# Patient Record
Sex: Male | Born: 1955 | Race: White | Hispanic: No | Marital: Married | Smoking: Never smoker
Health system: Southern US, Community
[De-identification: ages and names within clinical notes are randomized; demographics above are authoritative.]

## PROBLEM LIST (undated history)

## (undated) DIAGNOSIS — G479 Sleep disorder, unspecified: Secondary | ICD-10-CM

## (undated) DIAGNOSIS — K7030 Alcoholic cirrhosis of liver without ascites: Secondary | ICD-10-CM

## (undated) DIAGNOSIS — J948 Other specified pleural conditions: Secondary | ICD-10-CM

## (undated) DIAGNOSIS — K449 Diaphragmatic hernia without obstruction or gangrene: Secondary | ICD-10-CM

## (undated) DIAGNOSIS — R161 Splenomegaly, not elsewhere classified: Secondary | ICD-10-CM

## (undated) DIAGNOSIS — N400 Enlarged prostate without lower urinary tract symptoms: Secondary | ICD-10-CM

## (undated) DIAGNOSIS — E1165 Type 2 diabetes mellitus with hyperglycemia: Secondary | ICD-10-CM

## (undated) DIAGNOSIS — D509 Iron deficiency anemia, unspecified: Secondary | ICD-10-CM

## (undated) DIAGNOSIS — G4733 Obstructive sleep apnea (adult) (pediatric): Secondary | ICD-10-CM

## (undated) DIAGNOSIS — M1990 Unspecified osteoarthritis, unspecified site: Secondary | ICD-10-CM

## (undated) DIAGNOSIS — I10 Essential (primary) hypertension: Secondary | ICD-10-CM

## (undated) DIAGNOSIS — I8500 Esophageal varices without bleeding: Secondary | ICD-10-CM

## (undated) DIAGNOSIS — K219 Gastro-esophageal reflux disease without esophagitis: Secondary | ICD-10-CM

## (undated) DIAGNOSIS — K922 Gastrointestinal hemorrhage, unspecified: Secondary | ICD-10-CM

## (undated) DIAGNOSIS — R32 Unspecified urinary incontinence: Secondary | ICD-10-CM

## (undated) DIAGNOSIS — F1011 Alcohol abuse, in remission: Secondary | ICD-10-CM

## (undated) DIAGNOSIS — D61818 Other pancytopenia: Secondary | ICD-10-CM

## (undated) DIAGNOSIS — E785 Hyperlipidemia, unspecified: Secondary | ICD-10-CM

## (undated) DIAGNOSIS — K7460 Unspecified cirrhosis of liver: Secondary | ICD-10-CM

## (undated) DIAGNOSIS — Z86010 Personal history of colonic polyps: Secondary | ICD-10-CM

## (undated) DIAGNOSIS — F32A Depression, unspecified: Secondary | ICD-10-CM

## (undated) DIAGNOSIS — F329 Major depressive disorder, single episode, unspecified: Secondary | ICD-10-CM

## (undated) DIAGNOSIS — E1140 Type 2 diabetes mellitus with diabetic neuropathy, unspecified: Secondary | ICD-10-CM

## (undated) HISTORY — DX: Unspecified cirrhosis of liver: K74.60

## (undated) HISTORY — DX: Essential (primary) hypertension: I10

## (undated) HISTORY — DX: Unspecified osteoarthritis, unspecified site: M19.90

## (undated) HISTORY — DX: Gastro-esophageal reflux disease without esophagitis: K21.9

## (undated) HISTORY — DX: Type 2 diabetes mellitus with hyperglycemia: E11.65

## (undated) HISTORY — DX: Other pancytopenia: D61.818

## (undated) HISTORY — DX: Diaphragmatic hernia without obstruction or gangrene: K44.9

## (undated) HISTORY — DX: Gastrointestinal hemorrhage, unspecified: K92.2

## (undated) HISTORY — DX: Iron deficiency anemia, unspecified: D50.9

## (undated) HISTORY — DX: Sleep disorder, unspecified: G47.9

## (undated) HISTORY — DX: Unspecified urinary incontinence: R32

## (undated) HISTORY — PX: ESOPHAGEAL VARICE LIGATION: SHX625

## (undated) HISTORY — DX: Hyperlipidemia, unspecified: E78.5

## (undated) HISTORY — DX: Major depressive disorder, single episode, unspecified: F32.9

## (undated) HISTORY — DX: Benign prostatic hyperplasia without lower urinary tract symptoms: N40.0

## (undated) HISTORY — DX: Personal history of colonic polyps: Z86.010

## (undated) HISTORY — DX: Splenomegaly, not elsewhere classified: R16.1

## (undated) HISTORY — DX: Depression, unspecified: F32.A

## (undated) HISTORY — DX: Other specified pleural conditions: J94.8

## (undated) HISTORY — PX: ESOPHAGEAL DILATION: SHX303

## (undated) HISTORY — DX: Alcohol abuse, in remission: F10.11

## (undated) HISTORY — DX: Obstructive sleep apnea (adult) (pediatric): G47.33

## (undated) HISTORY — DX: Type 2 diabetes mellitus with diabetic neuropathy, unspecified: E11.40

## (undated) HISTORY — DX: Esophageal varices without bleeding: I85.00

## (undated) HISTORY — DX: Alcoholic cirrhosis of liver without ascites: K70.30

---

## 1999-06-20 DIAGNOSIS — E1165 Type 2 diabetes mellitus with hyperglycemia: Secondary | ICD-10-CM

## 1999-06-20 DIAGNOSIS — IMO0002 Reserved for concepts with insufficient information to code with codable children: Secondary | ICD-10-CM

## 1999-06-20 HISTORY — DX: Type 2 diabetes mellitus with hyperglycemia: E11.65

## 1999-06-20 HISTORY — DX: Reserved for concepts with insufficient information to code with codable children: IMO0002

## 2008-08-17 ENCOUNTER — Ambulatory Visit: Payer: Self-pay | Admitting: Oncology

## 2008-09-07 ENCOUNTER — Inpatient Hospital Stay: Payer: Self-pay | Admitting: *Deleted

## 2008-09-17 ENCOUNTER — Ambulatory Visit: Payer: Self-pay | Admitting: Oncology

## 2008-09-30 ENCOUNTER — Ambulatory Visit: Payer: Self-pay | Admitting: Gastroenterology

## 2008-10-04 ENCOUNTER — Inpatient Hospital Stay: Payer: Self-pay | Admitting: *Deleted

## 2008-10-17 ENCOUNTER — Ambulatory Visit: Payer: Self-pay | Admitting: Oncology

## 2008-12-22 ENCOUNTER — Ambulatory Visit: Payer: Self-pay | Admitting: Internal Medicine

## 2009-01-05 ENCOUNTER — Ambulatory Visit: Payer: Self-pay | Admitting: Internal Medicine

## 2009-01-17 ENCOUNTER — Ambulatory Visit: Payer: Self-pay | Admitting: Internal Medicine

## 2009-02-17 ENCOUNTER — Ambulatory Visit: Payer: Self-pay | Admitting: Internal Medicine

## 2009-06-19 DIAGNOSIS — J948 Other specified pleural conditions: Secondary | ICD-10-CM

## 2009-06-19 HISTORY — DX: Other specified pleural conditions: J94.8

## 2009-07-21 ENCOUNTER — Emergency Department: Payer: Self-pay | Admitting: Emergency Medicine

## 2009-09-10 ENCOUNTER — Emergency Department: Payer: Self-pay | Admitting: Internal Medicine

## 2010-04-19 ENCOUNTER — Ambulatory Visit: Payer: Self-pay | Admitting: Internal Medicine

## 2010-04-29 ENCOUNTER — Inpatient Hospital Stay: Payer: Self-pay | Admitting: Internal Medicine

## 2010-05-11 ENCOUNTER — Inpatient Hospital Stay: Payer: Self-pay | Admitting: Internal Medicine

## 2010-06-19 HISTORY — PX: TIPS PROCEDURE: SHX808

## 2010-11-05 ENCOUNTER — Other Ambulatory Visit: Payer: Self-pay | Admitting: Unknown Physician Specialty

## 2011-04-24 ENCOUNTER — Inpatient Hospital Stay: Payer: Self-pay | Admitting: Internal Medicine

## 2011-06-20 DIAGNOSIS — R161 Splenomegaly, not elsewhere classified: Secondary | ICD-10-CM

## 2011-06-20 HISTORY — DX: Splenomegaly, not elsewhere classified: R16.1

## 2011-07-21 ENCOUNTER — Ambulatory Visit: Payer: Self-pay | Admitting: Internal Medicine

## 2011-08-10 ENCOUNTER — Inpatient Hospital Stay: Payer: Self-pay | Admitting: Internal Medicine

## 2011-08-10 LAB — PROTIME-INR
INR: 1.4
Prothrombin Time: 17.9 secs — ABNORMAL HIGH (ref 11.5–14.7)

## 2011-08-10 LAB — TROPONIN I: Troponin-I: <0.02 ng/mL

## 2011-08-10 LAB — COMPREHENSIVE METABOLIC PANEL
Albumin: 2.6 g/dL — ABNORMAL LOW (ref 3.4–5.0)
Anion Gap: 12 (ref 7–16)
Bilirubin,Total: 1.3 mg/dL — ABNORMAL HIGH (ref 0.2–1.0)
Calcium, Total: 7.8 mg/dL — ABNORMAL LOW (ref 8.5–10.1)
Chloride: 107 mmol/L (ref 98–107)
Creatinine: 0.81 mg/dL (ref 0.60–1.30)
Glucose: 420 mg/dL — ABNORMAL HIGH (ref 65–99)
Osmolality: 297 (ref 275–301)
Potassium: 4.2 mmol/L (ref 3.5–5.1)
SGOT(AST): 34 U/L (ref 15–37)
Total Protein: 6.1 g/dL — ABNORMAL LOW (ref 6.4–8.2)

## 2011-08-10 LAB — LACTATE DEHYDROGENASE: LDH: 195 U/L (ref 87–241)

## 2011-08-10 LAB — CBC WITH DIFFERENTIAL/PLATELET
Basophil %: 1.1 %
Eosinophil #: 0.1 10*3/uL (ref 0.0–0.7)
Eosinophil %: 3.1 %
HCT: 15.1 % — ABNORMAL LOW (ref 40.0–52.0)
HGB: 4.4 g/dL — CL (ref 13.0–18.0)
Lymphocyte %: 35.9 %
MCH: 17.3 pg — ABNORMAL LOW (ref 26.0–34.0)
MCHC: 29.1 g/dL — ABNORMAL LOW (ref 32.0–36.0)
Neutrophil #: 0.9 10*3/uL — ABNORMAL LOW (ref 1.4–6.5)
Platelet: 29 10*3/uL — CL (ref 150–440)
RBC: 2.56 10*6/uL — ABNORMAL LOW (ref 4.40–5.90)
WBC: 1.7 10*3/uL — CL (ref 3.8–10.6)

## 2011-08-10 LAB — CK TOTAL AND CKMB (NOT AT ARMC): CK-MB: 1.6 ng/mL (ref 0.5–3.6)

## 2011-08-11 LAB — AMMONIA: Ammonia, Plasma: 118 mcmol/L — ABNORMAL HIGH (ref 11–32)

## 2011-08-11 LAB — CBC WITH DIFFERENTIAL/PLATELET
Basophil #: 0.1 10*3/uL (ref 0.0–0.1)
Basophil %: 1.1 %
Eosinophil #: 0.2 10*3/uL (ref 0.0–0.7)
Eosinophil %: 4.2 %
HCT: 23.5 % — ABNORMAL LOW (ref 40.0–52.0)
HGB: 7.3 g/dL — ABNORMAL LOW (ref 13.0–18.0)
Lymphocyte #: 1.6 10*3/uL (ref 1.0–3.6)
Lymphocyte %: 36.4 %
MCH: 20.2 pg — ABNORMAL LOW (ref 26.0–34.0)
MCHC: 31 g/dL — ABNORMAL LOW (ref 32.0–36.0)
MCV: 65 fL — ABNORMAL LOW (ref 80–100)
Monocyte #: 0.4 10*3/uL (ref 0.0–0.7)
Monocyte %: 9.4 %
Neutrophil #: 2.2 10*3/uL (ref 1.4–6.5)
Neutrophil %: 48.9 %
Platelet: 44 10*3/uL — ABNORMAL LOW (ref 150–440)
RBC: 3.6 10*6/uL — ABNORMAL LOW (ref 4.40–5.90)
RDW: 26.8 % — ABNORMAL HIGH (ref 11.5–14.5)
WBC: 4.5 10*3/uL (ref 3.8–10.6)

## 2011-08-11 LAB — BASIC METABOLIC PANEL
Anion Gap: 12 (ref 7–16)
BUN: 14 mg/dL (ref 7–18)
Calcium, Total: 7.9 mg/dL — ABNORMAL LOW (ref 8.5–10.1)
Chloride: 109 mmol/L — ABNORMAL HIGH (ref 98–107)
Co2: 23 mmol/L (ref 21–32)
Creatinine: 1.01 mg/dL (ref 0.60–1.30)
EGFR (African American): >60
EGFR (Non-African Amer.): >60
Glucose: 281 mg/dL — ABNORMAL HIGH (ref 65–99)
Osmolality: 297 (ref 275–301)
Potassium: 4.4 mmol/L (ref 3.5–5.1)
Sodium: 144 mmol/L (ref 136–145)

## 2011-08-11 LAB — TROPONIN I: Troponin-I: <0.02 ng/mL

## 2011-08-12 LAB — CBC WITH DIFFERENTIAL/PLATELET
Basophil #: 0.1 10*3/uL (ref 0.0–0.1)
Basophil %: 1.4 %
Eosinophil #: 0.2 10*3/uL (ref 0.0–0.7)
Eosinophil %: 4.3 %
HGB: 7.5 g/dL — ABNORMAL LOW (ref 13.0–18.0)
Lymphocyte %: 48.2 %
MCHC: 31.5 g/dL — ABNORMAL LOW (ref 32.0–36.0)
MCV: 67 fL — ABNORMAL LOW (ref 80–100)
Monocyte %: 8.3 %
Neutrophil #: 1.4 10*3/uL (ref 1.4–6.5)
RBC: 3.55 10*6/uL — ABNORMAL LOW (ref 4.40–5.90)
WBC: 3.7 10*3/uL — ABNORMAL LOW (ref 3.8–10.6)

## 2011-08-12 LAB — IRON AND TIBC
Iron Bind.Cap.(Total): 374 ug/dL (ref 250–450)
Iron Saturation: 8 %
Iron: 30 ug/dL — ABNORMAL LOW (ref 65–175)
Unbound Iron-Bind.Cap.: 344 ug/dL

## 2011-08-12 LAB — RETICULOCYTES: Reticulocyte: 3.5 % — ABNORMAL HIGH (ref 0.5–1.5)

## 2011-08-15 LAB — CULTURE, BLOOD (SINGLE)

## 2011-08-18 ENCOUNTER — Ambulatory Visit: Payer: Self-pay | Admitting: Internal Medicine

## 2011-08-21 ENCOUNTER — Ambulatory Visit: Payer: Self-pay | Admitting: Oncology

## 2011-08-21 LAB — CANCER CENTER HEMOGLOBIN: HGB: 6.6 g/dL — ABNORMAL LOW (ref 13.0–18.0)

## 2011-08-24 ENCOUNTER — Observation Stay: Payer: Self-pay | Admitting: Internal Medicine

## 2011-08-24 LAB — CBC WITH DIFFERENTIAL/PLATELET
Basophil #: 0 10*3/uL (ref 0.0–0.1)
Basophil %: 1.2 %
Eosinophil #: 0.1 10*3/uL (ref 0.0–0.7)
Eosinophil %: 3.2 %
HGB: 6.5 g/dL — ABNORMAL LOW (ref 13.0–18.0)
Lymphocyte #: 1.2 10*3/uL (ref 1.0–3.6)
MCH: 22.6 pg — ABNORMAL LOW (ref 26.0–34.0)
MCHC: 31.4 g/dL — ABNORMAL LOW (ref 32.0–36.0)
MCV: 72 fL — ABNORMAL LOW (ref 80–100)
Monocyte #: 0.2 10*3/uL (ref 0.0–0.7)
Monocyte %: 6.8 %
Neutrophil %: 48.9 %
RBC: 2.89 10*6/uL — ABNORMAL LOW (ref 4.40–5.90)
WBC: 3 10*3/uL — ABNORMAL LOW (ref 3.8–10.6)

## 2011-08-24 LAB — BASIC METABOLIC PANEL
Anion Gap: 12 (ref 7–16)
Calcium, Total: 8 mg/dL — ABNORMAL LOW (ref 8.5–10.1)
Co2: 20 mmol/L — ABNORMAL LOW (ref 21–32)
Creatinine: 0.77 mg/dL (ref 0.60–1.30)
EGFR (African American): >60
EGFR (Non-African Amer.): >60
Potassium: 4.1 mmol/L (ref 3.5–5.1)
Sodium: 143 mmol/L (ref 136–145)

## 2011-08-25 LAB — HEMOGLOBIN: HGB: 8.3 g/dL — ABNORMAL LOW (ref 13.0–18.0)

## 2011-08-29 LAB — CBC CANCER CENTER
Basophil %: 1.2 %
HCT: 24.3 % — ABNORMAL LOW (ref 40.0–52.0)
HGB: 8 g/dL — ABNORMAL LOW (ref 13.0–18.0)
Lymphocyte #: 0.9 x10 3/mm — ABNORMAL LOW (ref 1.0–3.6)
Lymphocyte %: 39 %
MCH: 24.3 pg — ABNORMAL LOW (ref 26.0–34.0)
MCHC: 32.9 g/dL (ref 32.0–36.0)
MCV: 74 fL — ABNORMAL LOW (ref 80–100)
Monocyte %: 6.4 %
Neutrophil #: 1.2 x10 3/mm — ABNORMAL LOW (ref 1.4–6.5)
Platelet: 35 x10 3/mm — ABNORMAL LOW (ref 150–440)
WBC: 2.4 x10 3/mm — ABNORMAL LOW (ref 3.8–10.6)

## 2011-09-01 LAB — CBC CANCER CENTER
Basophil #: 0 x10 3/mm (ref 0.0–0.1)
Basophil %: 0.9 %
Eosinophil #: 0.1 x10 3/mm (ref 0.0–0.7)
HGB: 7.4 g/dL — ABNORMAL LOW (ref 13.0–18.0)
Lymphocyte #: 0.7 x10 3/mm — ABNORMAL LOW (ref 1.0–3.6)
Lymphocyte %: 33.6 %
Monocyte #: 0.2 x10 3/mm (ref 0.0–0.7)
Neutrophil #: 1.2 x10 3/mm — ABNORMAL LOW (ref 1.4–6.5)
Neutrophil %: 54.2 %
Platelet: 35 x10 3/mm — ABNORMAL LOW (ref 150–440)
RBC: 3.2 10*6/uL — ABNORMAL LOW (ref 4.40–5.90)
WBC: 2.1 x10 3/mm — ABNORMAL LOW (ref 3.8–10.6)

## 2011-09-05 LAB — FERRITIN: Ferritin (ARMC): 9 ng/mL (ref 8–388)

## 2011-09-05 LAB — CANCER CENTER HEMOGLOBIN: HGB: 8.1 g/dL — ABNORMAL LOW (ref 13.0–18.0)

## 2011-09-12 LAB — CANCER CENTER HEMOGLOBIN: HGB: 8.7 g/dL — ABNORMAL LOW (ref 13.0–18.0)

## 2011-09-18 ENCOUNTER — Ambulatory Visit: Payer: Self-pay | Admitting: Internal Medicine

## 2011-09-18 ENCOUNTER — Ambulatory Visit: Payer: Self-pay | Admitting: Oncology

## 2011-09-18 LAB — CBC CANCER CENTER
Basophil #: 0 x10 3/mm (ref 0.0–0.1)
HCT: 24.9 % — ABNORMAL LOW (ref 40.0–52.0)
HGB: 7.8 g/dL — ABNORMAL LOW (ref 13.0–18.0)
Lymphocyte #: 0.9 x10 3/mm — ABNORMAL LOW (ref 1.0–3.6)
Lymphocyte %: 41.6 %
MCHC: 31.5 g/dL — ABNORMAL LOW (ref 32.0–36.0)
MCV: 73 fL — ABNORMAL LOW (ref 80–100)
Monocyte %: 7.5 %
RBC: 3.42 10*6/uL — ABNORMAL LOW (ref 4.40–5.90)

## 2011-09-27 LAB — CANCER CENTER HEMOGLOBIN: HGB: 9 g/dL — ABNORMAL LOW (ref 13.0–18.0)

## 2011-09-27 LAB — FERRITIN: Ferritin (ARMC): 11 ng/mL (ref 8–388)

## 2011-10-02 LAB — CANCER CENTER HEMOGLOBIN: HGB: 8.9 g/dL — ABNORMAL LOW (ref 13.0–18.0)

## 2011-10-18 ENCOUNTER — Ambulatory Visit: Payer: Self-pay | Admitting: Internal Medicine

## 2011-10-18 ENCOUNTER — Ambulatory Visit: Payer: Self-pay | Admitting: Oncology

## 2011-11-03 LAB — CANCER CENTER HEMOGLOBIN: HGB: 10.9 g/dL — ABNORMAL LOW (ref 13.0–18.0)

## 2011-11-03 LAB — FERRITIN: Ferritin (ARMC): 17 ng/mL (ref 8–388)

## 2011-11-21 ENCOUNTER — Ambulatory Visit: Payer: Self-pay | Admitting: Unknown Physician Specialty

## 2011-11-21 ENCOUNTER — Emergency Department: Payer: Self-pay | Admitting: Emergency Medicine

## 2011-11-21 LAB — COMPREHENSIVE METABOLIC PANEL
BUN: 12 mg/dL (ref 7–18)
Bilirubin,Total: 1.8 mg/dL — ABNORMAL HIGH (ref 0.2–1.0)
Chloride: 110 mmol/L — ABNORMAL HIGH (ref 98–107)
Creatinine: 0.75 mg/dL (ref 0.60–1.30)
EGFR (African American): >60
EGFR (Non-African Amer.): >60
Glucose: 104 mg/dL — ABNORMAL HIGH (ref 65–99)
Osmolality: 283 (ref 275–301)
SGOT(AST): 48 U/L — ABNORMAL HIGH (ref 15–37)
SGPT (ALT): 18 U/L
Sodium: 142 mmol/L (ref 136–145)
Total Protein: 6.8 g/dL (ref 6.4–8.2)

## 2011-11-21 LAB — CBC
HCT: 37.3 % — ABNORMAL LOW (ref 40.0–52.0)
MCH: 24.5 pg — ABNORMAL LOW (ref 26.0–34.0)
MCHC: 33.5 g/dL (ref 32.0–36.0)
MCV: 73 fL — ABNORMAL LOW (ref 80–100)
Platelet: 45 10*3/uL — ABNORMAL LOW (ref 150–440)
RBC: 5.11 10*6/uL (ref 4.40–5.90)
RDW: 21.6 % — ABNORMAL HIGH (ref 11.5–14.5)
WBC: 6.3 10*3/uL (ref 3.8–10.6)

## 2011-11-21 LAB — PROTIME-INR
INR: 1.3
Prothrombin Time: 17 secs — ABNORMAL HIGH (ref 11.5–14.7)

## 2011-11-21 LAB — APTT: Activated PTT: 42 secs — ABNORMAL HIGH (ref 23.6–35.9)

## 2011-11-22 ENCOUNTER — Ambulatory Visit: Payer: Self-pay | Admitting: Internal Medicine

## 2011-11-27 ENCOUNTER — Ambulatory Visit: Payer: Self-pay | Admitting: Oncology

## 2011-11-27 LAB — FERRITIN: Ferritin (ARMC): 26 ng/mL (ref 8–388)

## 2011-11-27 LAB — CANCER CENTER HEMOGLOBIN: HGB: 11.8 g/dL — ABNORMAL LOW (ref 13.0–18.0)

## 2011-12-07 LAB — CANCER CENTER HEMOGLOBIN: HGB: 10.7 g/dL — ABNORMAL LOW (ref 13.0–18.0)

## 2011-12-18 ENCOUNTER — Ambulatory Visit: Payer: Self-pay | Admitting: Oncology

## 2011-12-18 ENCOUNTER — Ambulatory Visit: Payer: Self-pay | Admitting: Unknown Physician Specialty

## 2011-12-18 ENCOUNTER — Ambulatory Visit: Payer: Self-pay | Admitting: Internal Medicine

## 2011-12-18 LAB — CANCER CENTER HEMOGLOBIN: HGB: 12.4 g/dL — ABNORMAL LOW (ref 13.0–18.0)

## 2011-12-29 LAB — CBC CANCER CENTER
Basophil #: 0 x10 3/mm (ref 0.0–0.1)
Basophil %: 1.3 %
Eosinophil #: 0.1 x10 3/mm (ref 0.0–0.7)
HCT: 32.3 % — ABNORMAL LOW (ref 40.0–52.0)
HGB: 10.6 g/dL — ABNORMAL LOW (ref 13.0–18.0)
MCH: 25.9 pg — ABNORMAL LOW (ref 26.0–34.0)
MCHC: 32.8 g/dL (ref 32.0–36.0)
MCV: 79 fL — ABNORMAL LOW (ref 80–100)
Monocyte #: 0.3 x10 3/mm (ref 0.2–1.0)
Neutrophil #: 1.1 x10 3/mm — ABNORMAL LOW (ref 1.4–6.5)
Neutrophil %: 47.1 %
RBC: 4.1 10*6/uL — ABNORMAL LOW (ref 4.40–5.90)
RDW: 20.9 % — ABNORMAL HIGH (ref 11.5–14.5)

## 2011-12-29 LAB — FERRITIN: Ferritin (ARMC): 16 ng/mL

## 2012-01-18 ENCOUNTER — Ambulatory Visit: Payer: Self-pay | Admitting: Internal Medicine

## 2012-01-26 ENCOUNTER — Ambulatory Visit: Payer: Self-pay | Admitting: Oncology

## 2012-01-26 LAB — FERRITIN: Ferritin (ARMC): 31 ng/mL (ref 8–388)

## 2012-02-05 ENCOUNTER — Emergency Department: Payer: Self-pay | Admitting: Emergency Medicine

## 2012-02-18 ENCOUNTER — Ambulatory Visit: Payer: Self-pay | Admitting: Oncology

## 2012-02-18 ENCOUNTER — Ambulatory Visit: Payer: Self-pay | Admitting: Internal Medicine

## 2012-03-08 LAB — FERRITIN: Ferritin (ARMC): 229 ng/mL (ref 8–388)

## 2012-03-19 ENCOUNTER — Ambulatory Visit: Payer: Self-pay | Admitting: Internal Medicine

## 2012-03-19 ENCOUNTER — Ambulatory Visit: Payer: Self-pay | Admitting: Oncology

## 2012-04-12 LAB — FERRITIN: Ferritin (ARMC): 94 ng/mL (ref 8–388)

## 2012-04-19 ENCOUNTER — Ambulatory Visit: Payer: Self-pay | Admitting: Oncology

## 2012-04-19 ENCOUNTER — Ambulatory Visit: Payer: Self-pay

## 2012-05-03 LAB — CANCER CENTER HEMOGLOBIN: HGB: 13.1 g/dL (ref 13.0–18.0)

## 2012-05-19 ENCOUNTER — Ambulatory Visit: Payer: Self-pay | Admitting: Oncology

## 2012-05-19 ENCOUNTER — Ambulatory Visit: Payer: Self-pay | Admitting: Internal Medicine

## 2012-06-19 ENCOUNTER — Ambulatory Visit: Payer: Self-pay | Admitting: Internal Medicine

## 2012-06-19 ENCOUNTER — Ambulatory Visit: Payer: Self-pay | Admitting: Oncology

## 2012-07-02 LAB — BASIC METABOLIC PANEL
Glucose: 357
Potassium: 4.4 mmol/L
Sodium: 133 mmol/L — AB (ref 137–147)

## 2012-07-02 LAB — HEMOGLOBIN A1C: A1c: 8.7

## 2012-07-10 LAB — FERRITIN: Ferritin (ARMC): 89 ng/mL (ref 8–388)

## 2012-07-20 ENCOUNTER — Ambulatory Visit: Payer: Self-pay | Admitting: Oncology

## 2012-08-17 ENCOUNTER — Ambulatory Visit: Payer: Self-pay | Admitting: Internal Medicine

## 2012-08-29 ENCOUNTER — Ambulatory Visit: Payer: Self-pay | Admitting: Unknown Physician Specialty

## 2012-09-05 ENCOUNTER — Ambulatory Visit: Payer: Self-pay | Admitting: Oncology

## 2012-09-05 LAB — CANCER CENTER HEMOGLOBIN: HGB: 12.4 g/dL — ABNORMAL LOW (ref 13.0–18.0)

## 2012-09-05 LAB — FERRITIN: Ferritin (ARMC): 49 ng/mL (ref 8–388)

## 2012-09-17 ENCOUNTER — Ambulatory Visit: Payer: Self-pay | Admitting: Internal Medicine

## 2012-10-17 ENCOUNTER — Ambulatory Visit: Payer: Self-pay | Admitting: Internal Medicine

## 2012-10-29 LAB — COMPREHENSIVE METABOLIC PANEL
Alkaline Phosphatase: 104 U/L
Creat: 0.6
Glucose: 186
Potassium: 3.7 mmol/L
Sodium: 136 mmol/L — AB (ref 137–147)

## 2012-10-29 LAB — CBC: WBC: 4.3

## 2012-10-31 LAB — FERRITIN: Ferritin (ARMC): 69 ng/mL (ref 8–388)

## 2012-11-17 ENCOUNTER — Ambulatory Visit: Payer: Self-pay | Admitting: Internal Medicine

## 2012-12-05 ENCOUNTER — Encounter: Payer: Self-pay | Admitting: Family Medicine

## 2012-12-05 ENCOUNTER — Ambulatory Visit (INDEPENDENT_AMBULATORY_CARE_PROVIDER_SITE_OTHER): Payer: Medicare Other | Admitting: Family Medicine

## 2012-12-05 VITALS — BP 144/72 | HR 84 | Temp 98.3°F | Ht 64.0 in | Wt 237.5 lb

## 2012-12-05 DIAGNOSIS — K7030 Alcoholic cirrhosis of liver without ascites: Secondary | ICD-10-CM

## 2012-12-05 DIAGNOSIS — K219 Gastro-esophageal reflux disease without esophagitis: Secondary | ICD-10-CM

## 2012-12-05 DIAGNOSIS — I10 Essential (primary) hypertension: Secondary | ICD-10-CM

## 2012-12-05 DIAGNOSIS — D509 Iron deficiency anemia, unspecified: Secondary | ICD-10-CM

## 2012-12-05 DIAGNOSIS — E1165 Type 2 diabetes mellitus with hyperglycemia: Secondary | ICD-10-CM

## 2012-12-05 DIAGNOSIS — E669 Obesity, unspecified: Secondary | ICD-10-CM

## 2012-12-05 DIAGNOSIS — E785 Hyperlipidemia, unspecified: Secondary | ICD-10-CM | POA: Insufficient documentation

## 2012-12-05 DIAGNOSIS — F329 Major depressive disorder, single episode, unspecified: Secondary | ICD-10-CM

## 2012-12-05 MED ORDER — OMEPRAZOLE 40 MG PO CPDR: 40.0000 mg | DELAYED_RELEASE_CAPSULE | Freq: Two times a day (BID) | ORAL | Status: DC

## 2012-12-05 MED ORDER — FUROSEMIDE 40 MG PO TABS: 40.0000 mg | ORAL_TABLET | Freq: Every day | ORAL | Status: AC

## 2012-12-05 MED ORDER — SPIRONOLACTONE 100 MG PO TABS: 50.0000 mg | ORAL_TABLET | Freq: Every day | ORAL | Status: AC

## 2012-12-05 NOTE — Assessment & Plan Note (Signed)
I have requested records from Dr. Neale Burly today.

## 2012-12-05 NOTE — Assessment & Plan Note (Signed)
Check FLP next visit 

## 2012-12-05 NOTE — Assessment & Plan Note (Signed)
Seems stable today.  No changes to regimen. Await records from prior PCP and GI.

## 2012-12-05 NOTE — Assessment & Plan Note (Addendum)
Await records and blood work from prior PCP.  Sounds uncontrolled based on recall cbg. Refer today to diabetes education. Recommended schedule eye exam.  Will need foot exam next visit. No changes today. rtc 3 mo for diabetes f/u.

## 2012-12-05 NOTE — Progress Notes (Signed)
Subjective:    Patient ID: [name], male    DOB: November 27, 1955, 57 y.o.   MRN: [id number]  HPI CC: new pt to establish  Prior saw Dr. Hyacinth Meeker.  No longer.  Alcoholic cirrhosis stage 4 - sees Dr. Markham Jordan GI at [facility] q3 months.  Told liver is stable.  Diagnosed 15 yrs ago.  Stopped drinking 15 yrs ago.   01/06/2013 planning on having EGD/coloncosopy.  Has noticed 20lb weight gain in last few months.  DM - 8-10 yrs ago diagnosed.  Unsure last A1c.  Sugars running elevated.  Lunch time sugar was 230.  Doesn't know what to eat. Has not undergone diabetes education.  No recent low sugars.  Eye exam - not seen in 2 years.  Foot exam - none recent   No results found for this basename: HGBA1C    H/o iron def anemia - sees Dr. Neale Burly at [facility] for this.  occasional iron and blood transfusions. HTN - only on lasix and spironolactone. HLD - off meds. GERD - on omeprazole 40mg  bid. Depression - dealing with this.  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  Past histories reviewed and updated if relevant as below. Patient Active Problem List   Diagnosis Date Noted  . Diabetes type 2, uncontrolled 12/05/2012  . Depression   . GERD (gastroesophageal reflux disease)   . HTN (hypertension)   . HLD (hyperlipidemia)   . Alcoholic cirrhosis   . Iron deficiency anemia    Past Medical History  Diagnosis Date  . Arthritis   . Depression   . GERD (gastroesophageal reflux disease)   . HTN (hypertension)   . HLD (hyperlipidemia)   . History of colon polyps   . Urine incontinence   . Alcohol abuse, in remission     no drink in 15 years  . Alcoholic cirrhosis     stage 4 Markham Jordan) s/p stent at [facility]  . Iron deficiency anemia    Past Surgical History  Procedure Laterality Date  . Hepatic artery stent  2013    some kind of stent in liver  . Esophageal dilation     History   Substance Use Topics  . Smoking status: Never Smoker   . Smokeless tobacco: Never Used  . Alcohol Use: No     Comment: in remission x 15 years   Family History  Problem Relation Age of Onset  . Diabetes Brother   . Diabetes Father   . Cancer Father     colon  . Cancer Mother     colon  . CAD Neg Hx   . Stroke Paternal Uncle    No Known Allergies No current outpatient prescriptions on file prior to visit.   No current facility-administered medications on file prior to visit.     Review of Systems  Constitutional: Positive for unexpected weight change (20lb weight gain in last few months). Negative for fever, chills, activity change, appetite change and fatigue.  HENT: Negative for hearing loss and neck pain.   Eyes: Negative for visual disturbance.  Respiratory: Positive for shortness of breath. Negative for cough, chest tightness and wheezing.   Cardiovascular: Positive for leg swelling. Negative for chest pain and palpitations.  Gastrointestinal: Positive for abdominal distention. Negative for nausea, vomiting, abdominal pain, diarrhea, constipation and blood in stool.  Genitourinary: Negative for hematuria and difficulty urinating.  Musculoskeletal: Negative for myalgias and arthralgias.  Skin: Negative for rash.  Neurological: Negative for dizziness, seizures, syncope and headaches.  Hematological: Negative for adenopathy. Bruises/bleeds easily.  Psychiatric/Behavioral: Positive for dysphoric mood. The patient is not nervous/anxious.        Objective:   Physical Exam  Nursing note and vitals reviewed. Constitutional: He is oriented to person, place, and time. He appears well-developed and well-nourished. No distress.  Central obesity  HENT:  Head: Normocephalic and atraumatic.  Right Ear: External ear normal.  Left Ear: External ear normal.  Nose: Nose normal.  Mouth/Throat: Oropharynx is clear and moist. No oropharyngeal exudate.  Eyes: Conjunctivae and EOM are  normal. Pupils are equal, round, and reactive to light. No scleral icterus.  Neck: Normal range of motion. Neck supple.  Cardiovascular: Normal rate, regular rhythm, normal heart sounds and intact distal pulses.   No murmur heard. Pulses:      Radial pulses are 2+ on the right side, and 2+ on the left side.  Pulmonary/Chest: Effort normal and breath sounds normal. No respiratory distress. He has no wheezes. He has no rales.  Abdominal: Soft. Normal appearance and bowel sounds are normal. He exhibits distension and fluid wave (small). He exhibits no mass. There is no hepatosplenomegaly. There is no tenderness. There is no rigidity, no rebound, no guarding, no CVA tenderness and negative Murphy's sign.  Musculoskeletal: Normal range of motion. He exhibits edema (tr to 1+ pedal edema bilaterally).  Lymphadenopathy:    He has no cervical adenopathy.  Neurological: He is alert and oriented to person, place, and time.  CN grossly intact, station and gait intact  Skin: Skin is warm and dry. Rash (hyperpigmented macular rash on breastline) noted.  Psychiatric: He has a normal mood and affect. His behavior is normal. Judgment and thought content normal.       Assessment & Plan:

## 2012-12-05 NOTE — Assessment & Plan Note (Signed)
Chronic, stable on current meds. no changes today.

## 2012-12-05 NOTE — Patient Instructions (Addendum)
Pass by Marion's office to set up appointment for diabetes education. Return to see me in 3 months for follow up, sooner if needed.  Return prior fasting for blood work. I will request records from Dr. Hyacinth Meeker and Dr. Markham Jordan and Dr. Toma Deiters at [facility]. Good to meet you today! Call us with questions.

## 2012-12-05 NOTE — Assessment & Plan Note (Signed)
Continue omeprazole 40mg  bid. Pending EGD next month.

## 2012-12-08 ENCOUNTER — Encounter: Payer: Self-pay | Admitting: Family Medicine

## 2012-12-09 ENCOUNTER — Telehealth: Payer: Self-pay

## 2012-12-09 NOTE — Telephone Encounter (Signed)
Pt request referral to ear doctor; difficulty hearing; pt cannot hear out of right ear. Pt wants referral in [place].

## 2012-12-10 NOTE — Telephone Encounter (Signed)
I'd recommend coming in for office visit here first for evaluation.

## 2012-12-10 NOTE — Telephone Encounter (Signed)
Appt scheduled

## 2012-12-16 ENCOUNTER — Encounter: Payer: Self-pay | Admitting: Family Medicine

## 2012-12-22 ENCOUNTER — Encounter: Payer: Self-pay | Admitting: Family Medicine

## 2012-12-23 ENCOUNTER — Ambulatory Visit: Payer: Medicare Other | Admitting: Family Medicine

## 2012-12-25 ENCOUNTER — Ambulatory Visit: Payer: Self-pay | Admitting: Internal Medicine

## 2013-01-06 ENCOUNTER — Ambulatory Visit: Payer: Self-pay | Admitting: Unknown Physician Specialty

## 2013-01-13 ENCOUNTER — Encounter: Payer: Self-pay | Admitting: Family Medicine

## 2013-01-14 ENCOUNTER — Ambulatory Visit: Payer: Medicare Other | Admitting: *Deleted

## 2013-01-17 ENCOUNTER — Encounter: Payer: Self-pay | Admitting: Unknown Physician Specialty

## 2013-01-21 ENCOUNTER — Ambulatory Visit: Payer: Self-pay | Admitting: Internal Medicine

## 2013-01-24 ENCOUNTER — Other Ambulatory Visit: Payer: Self-pay | Admitting: Family Medicine

## 2013-01-24 NOTE — Telephone Encounter (Signed)
Pt request status of insulin; pt will ck with [facility] [address]

## 2013-02-06 LAB — CANCER CENTER HEMOGLOBIN: HGB: 9.9 g/dL — ABNORMAL LOW (ref 13.0–18.0)

## 2013-02-06 LAB — FERRITIN: Ferritin (ARMC): 12 ng/mL (ref 8–388)

## 2013-02-17 ENCOUNTER — Ambulatory Visit: Payer: Self-pay | Admitting: Internal Medicine

## 2013-02-25 IMAGING — CR DG CHEST 2V
1 series · 2 of 2 positions shown · non-contrast
Comparison: none

REASON FOR EXAM: cough
COMMENTS:

[Series 1: w chest pa · 0.14mm/px · 2 of 2 slices shown]
[im 1/2]
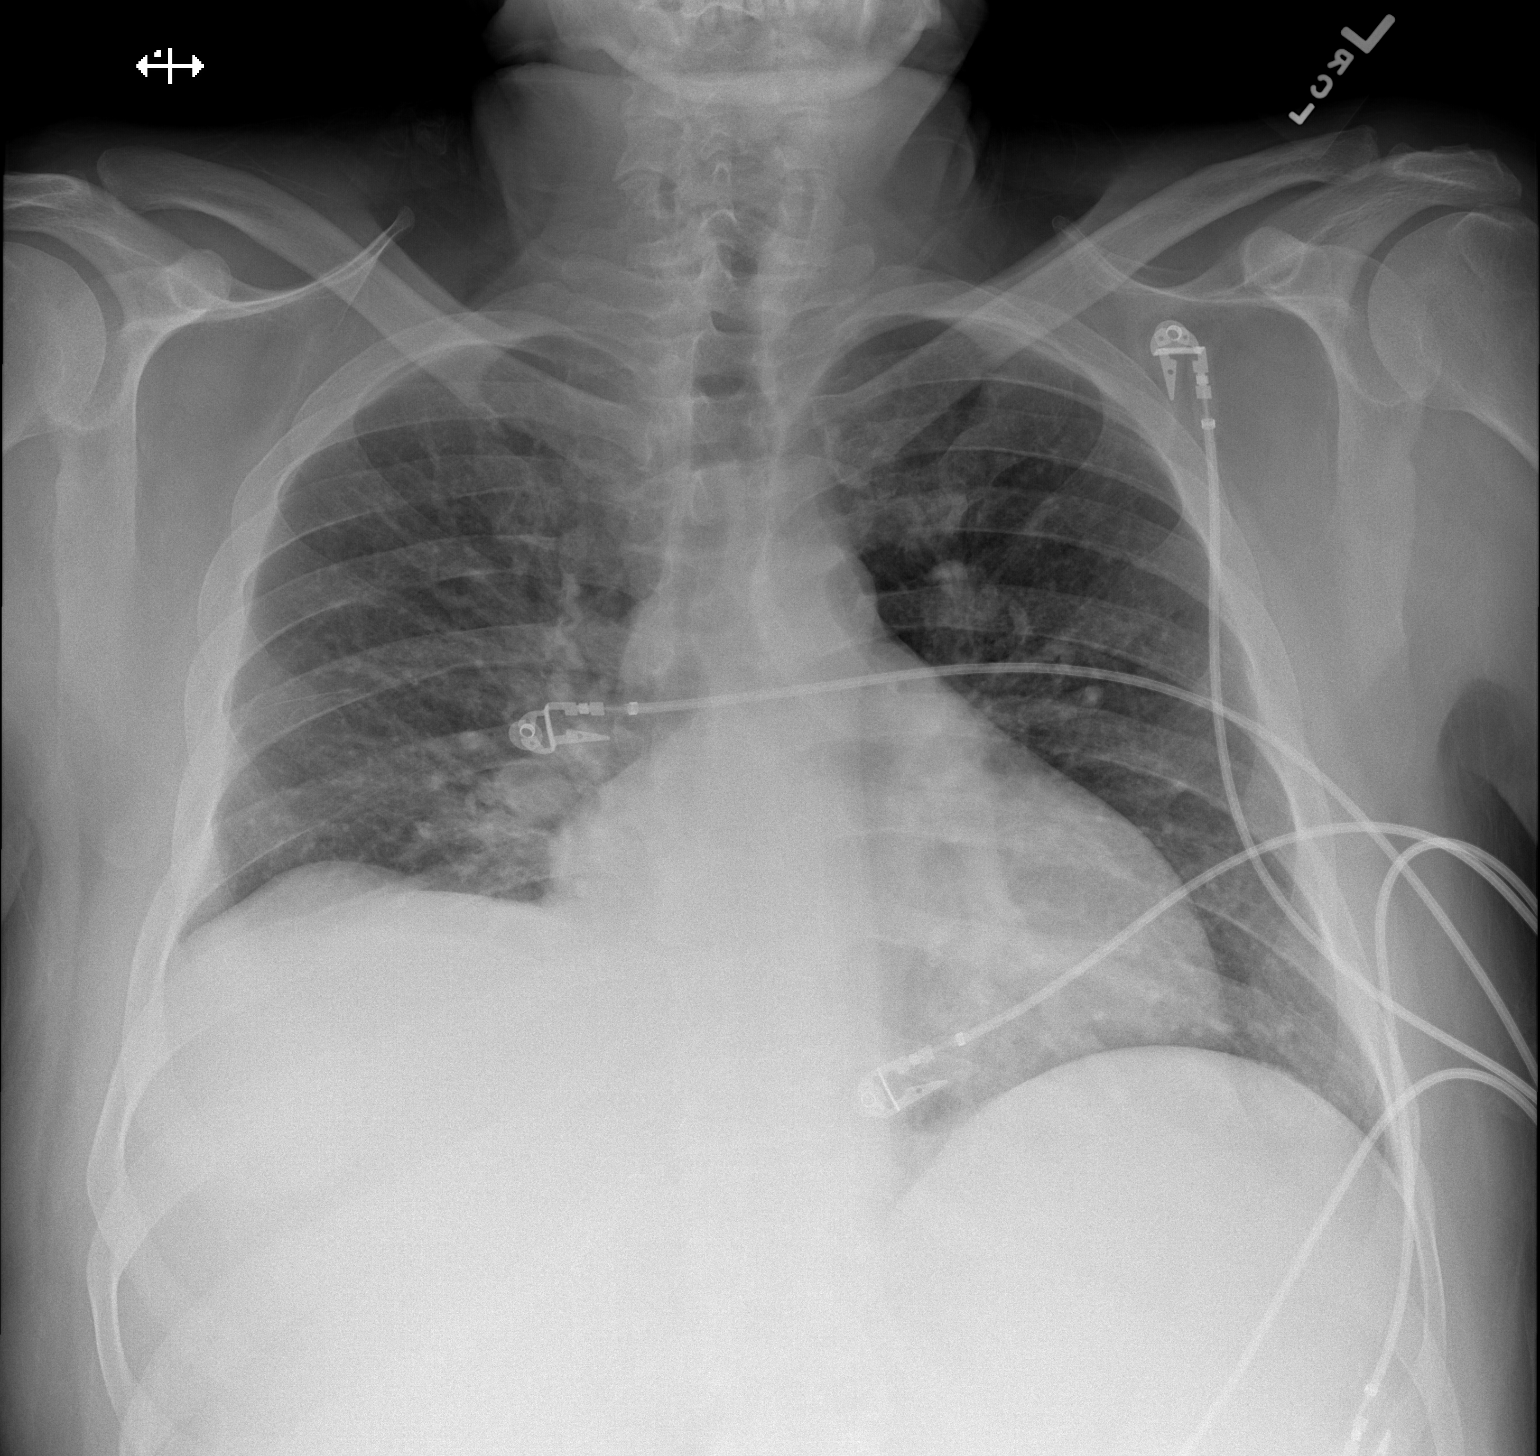
[im 2/2]
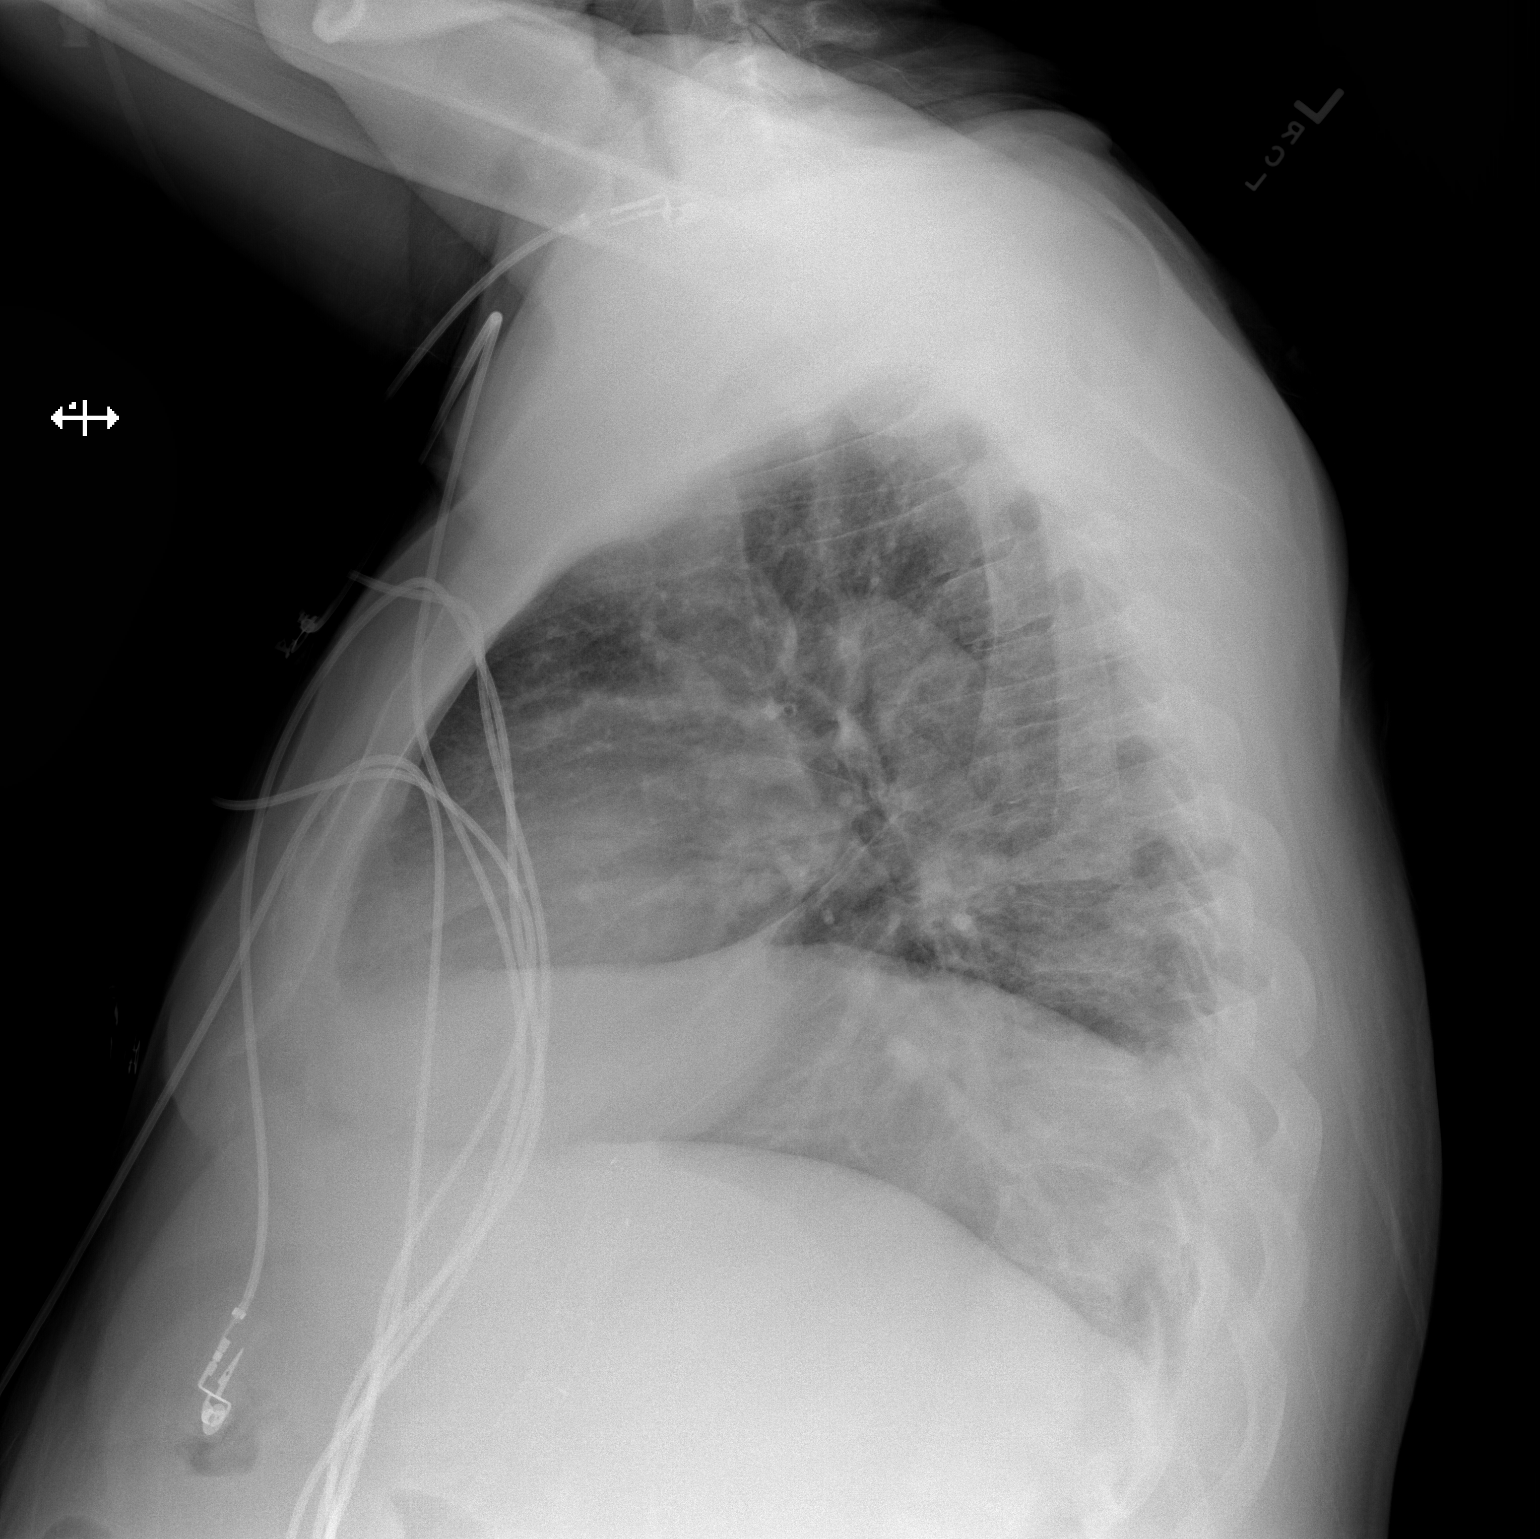

[2 of 2 positions shown; findings below may reference images not displayed]

PROCEDURE:     DXR - DXR CHEST PA (OR AP) AND LATERAL  - August 10, 2011 [DATE]

RESULT:     Comparison is made to the prior exam of 04/24/2011. The
inspiratory level is less than optimal. The lung fields are clear. No acute
changes of the heart or pulmonary vasculature are seen. The heart is again
noted to be upper limits for normal in size.
IMPRESSION: 1.     No acute changes are identified.

## 2013-03-17 ENCOUNTER — Inpatient Hospital Stay: Payer: Self-pay | Admitting: Student

## 2013-03-17 LAB — AMMONIA: Ammonia, Plasma: 68 mcmol/L — ABNORMAL HIGH (ref 11–32)

## 2013-03-17 LAB — CBC
HCT: 17 % — ABNORMAL LOW (ref 40.0–52.0)
HGB: 5.2 g/dL — ABNORMAL LOW (ref 13.0–18.0)
MCHC: 30.4 g/dL — ABNORMAL LOW (ref 32.0–36.0)
MCV: 68 fL — ABNORMAL LOW (ref 80–100)
Platelet: 61 10*3/uL — ABNORMAL LOW (ref 150–440)
RDW: 19.5 % — ABNORMAL HIGH (ref 11.5–14.5)
WBC: 4.6 10*3/uL (ref 3.8–10.6)

## 2013-03-17 LAB — PROTIME-INR: INR: 1.3

## 2013-03-17 LAB — COMPREHENSIVE METABOLIC PANEL
Albumin: 2 g/dL — ABNORMAL LOW (ref 3.4–5.0)
Alkaline Phosphatase: 124 U/L (ref 50–136)
Anion Gap: 8 (ref 7–16)
Bilirubin,Total: 0.9 mg/dL (ref 0.2–1.0)
Calcium, Total: 8 mg/dL — ABNORMAL LOW (ref 8.5–10.1)
Co2: 22 mmol/L (ref 21–32)
Creatinine: 1.03 mg/dL (ref 0.60–1.30)
EGFR (African American): >60
EGFR (Non-African Amer.): >60
Glucose: 399 mg/dL — ABNORMAL HIGH (ref 65–99)
Osmolality: 297 (ref 275–301)
Potassium: 3.9 mmol/L (ref 3.5–5.1)
Sodium: 139 mmol/L (ref 136–145)
Total Protein: 5.4 g/dL — ABNORMAL LOW (ref 6.4–8.2)

## 2013-03-17 LAB — HEMOGLOBIN: HGB: 6.1 g/dL — ABNORMAL LOW (ref 13.0–18.0)

## 2013-03-17 LAB — CK TOTAL AND CKMB (NOT AT ARMC)
CK, Total: 115 U/L (ref 35–232)
CK-MB: 2.4 ng/mL (ref 0.5–3.6)

## 2013-03-17 LAB — TROPONIN I: Troponin-I: <0.02 ng/mL

## 2013-03-17 LAB — LIPASE, BLOOD: Lipase: 397 U/L — ABNORMAL HIGH (ref 73–393)

## 2013-03-17 LAB — APTT: Activated PTT: 36.6 secs — ABNORMAL HIGH (ref 23.6–35.9)

## 2013-03-18 LAB — HEMOGLOBIN
HGB: 9.9 g/dL — ABNORMAL LOW (ref 13.0–18.0)
HGB: 8.5 g/dL — ABNORMAL LOW (ref 13.0–18.0)

## 2013-03-18 LAB — CBC WITH DIFFERENTIAL/PLATELET
Basophil #: 0.1 10*3/uL (ref 0.0–0.1)
Eosinophil %: 5.7 %
HCT: 24.9 % — ABNORMAL LOW (ref 40.0–52.0)
HGB: 8 g/dL — ABNORMAL LOW (ref 13.0–18.0)
Lymphocyte #: 1.3 10*3/uL (ref 1.0–3.6)
Lymphocyte %: 24.6 %
MCH: 23.7 pg — ABNORMAL LOW (ref 26.0–34.0)
MCHC: 32.1 g/dL (ref 32.0–36.0)
MCV: 74 fL — ABNORMAL LOW (ref 80–100)
Neutrophil #: 3.2 10*3/uL (ref 1.4–6.5)
Neutrophil %: 59.4 %
Platelet: 55 10*3/uL — ABNORMAL LOW (ref 150–440)
RBC: 3.38 10*6/uL — ABNORMAL LOW (ref 4.40–5.90)
RDW: 22.2 % — ABNORMAL HIGH (ref 11.5–14.5)
WBC: 5.5 10*3/uL (ref 3.8–10.6)

## 2013-03-18 LAB — COMPREHENSIVE METABOLIC PANEL
Albumin: 1.9 g/dL — ABNORMAL LOW (ref 3.4–5.0)
Alkaline Phosphatase: 97 U/L (ref 50–136)
BUN: 24 mg/dL — ABNORMAL HIGH (ref 7–18)
Bilirubin,Total: 1.5 mg/dL — ABNORMAL HIGH (ref 0.2–1.0)
Chloride: 110 mmol/L — ABNORMAL HIGH (ref 98–107)
Creatinine: 1.42 mg/dL — ABNORMAL HIGH (ref 0.60–1.30)
Osmolality: 290 (ref 275–301)
SGOT(AST): 30 U/L (ref 15–37)
SGPT (ALT): 11 U/L — ABNORMAL LOW (ref 12–78)

## 2013-03-18 LAB — HEMOGLOBIN A1C: Hemoglobin A1C: 6.8 % — ABNORMAL HIGH (ref 4.2–6.3)

## 2013-03-18 LAB — MAGNESIUM: Magnesium: 1.6 mg/dL — ABNORMAL LOW

## 2013-03-19 LAB — CBC WITH DIFFERENTIAL/PLATELET
Basophil %: 1.5 %
Eosinophil #: 0.3 10*3/uL (ref 0.0–0.7)
Eosinophil %: 6 %
HCT: 24.1 % — ABNORMAL LOW (ref 40.0–52.0)
MCHC: 32.4 g/dL (ref 32.0–36.0)
Monocyte %: 9.6 %
Neutrophil #: 2.2 10*3/uL (ref 1.4–6.5)
RBC: 3.34 10*6/uL — ABNORMAL LOW (ref 4.40–5.90)
WBC: 4.8 10*3/uL (ref 3.8–10.6)

## 2013-03-19 LAB — URINALYSIS, COMPLETE
Bacteria: NONE SEEN
Bilirubin,UR: NEGATIVE
Blood: NEGATIVE
Glucose,UR: NEGATIVE mg/dL (ref 0–75)
Leukocyte Esterase: NEGATIVE
Nitrite: NEGATIVE
Ph: 5 (ref 4.5–8.0)
Protein: 30
Specific Gravity: 1.021 (ref 1.003–1.030)
Squamous Epithelial: 1
WBC UR: 3 /HPF (ref 0–5)

## 2013-03-19 LAB — HEMOGLOBIN: HGB: 8.6 g/dL — ABNORMAL LOW (ref 13.0–18.0)

## 2013-03-19 LAB — BASIC METABOLIC PANEL
Anion Gap: 6 — ABNORMAL LOW (ref 7–16)
BUN: 25 mg/dL — ABNORMAL HIGH (ref 7–18)
Calcium, Total: 7.3 mg/dL — ABNORMAL LOW (ref 8.5–10.1)
EGFR (Non-African Amer.): >60
Osmolality: 285 (ref 275–301)
Potassium: 3.6 mmol/L (ref 3.5–5.1)
Sodium: 140 mmol/L (ref 136–145)

## 2013-03-20 LAB — CBC WITH DIFFERENTIAL/PLATELET
Eosinophil #: 0.3 10*3/uL (ref 0.0–0.7)
Eosinophil %: 6 %
HCT: 25.8 % — ABNORMAL LOW (ref 40.0–52.0)
MCH: 23.2 pg — ABNORMAL LOW (ref 26.0–34.0)
MCHC: 32.4 g/dL (ref 32.0–36.0)
Monocyte #: 0.4 x10 3/mm (ref 0.2–1.0)
Monocyte %: 8 %
Neutrophil %: 45.2 %
RDW: 22.5 % — ABNORMAL HIGH (ref 11.5–14.5)

## 2013-03-21 LAB — HEMOGLOBIN: HGB: 8.7 g/dL — ABNORMAL LOW (ref 13.0–18.0)

## 2013-03-22 LAB — CULTURE, BLOOD (SINGLE)

## 2013-04-03 ENCOUNTER — Ambulatory Visit: Payer: Self-pay | Admitting: Internal Medicine

## 2013-04-03 LAB — CBC CANCER CENTER
Basophil %: 1.4 %
Eosinophil #: 0.1 x10 3/mm (ref 0.0–0.7)
Eosinophil %: 3.2 %
HCT: 22.2 % — ABNORMAL LOW (ref 40.0–52.0)
Lymphocyte #: 0.8 x10 3/mm — ABNORMAL LOW (ref 1.0–3.6)
Lymphocyte %: 27.2 %
MCHC: 32.2 g/dL (ref 32.0–36.0)
MCV: 68 fL — ABNORMAL LOW (ref 80–100)
Monocyte #: 0.2 x10 3/mm (ref 0.2–1.0)
Neutrophil #: 1.9 x10 3/mm (ref 1.4–6.5)
Platelet: 49 x10 3/mm — ABNORMAL LOW (ref 150–440)
RDW: 21.9 % — ABNORMAL HIGH (ref 11.5–14.5)
WBC: 3.1 x10 3/mm — ABNORMAL LOW (ref 3.8–10.6)

## 2013-04-03 LAB — BASIC METABOLIC PANEL
Chloride: 107 mmol/L (ref 98–107)
Creatinine: 0.73 mg/dL (ref 0.60–1.30)
EGFR (Non-African Amer.): >60
Glucose: 290 mg/dL — ABNORMAL HIGH (ref 65–99)
Osmolality: 286 (ref 275–301)
Sodium: 138 mmol/L (ref 136–145)

## 2013-04-03 LAB — FERRITIN: Ferritin (ARMC): 9 ng/mL (ref 8–388)

## 2013-04-07 LAB — CANCER CENTER HEMOGLOBIN: HGB: 8.4 g/dL — ABNORMAL LOW (ref 13.0–18.0)

## 2013-04-14 LAB — CANCER CENTER HEMOGLOBIN: HGB: 8.2 g/dL — ABNORMAL LOW (ref 13.0–18.0)

## 2013-04-18 LAB — FERRITIN: Ferritin (ARMC): 17 ng/mL (ref 8–388)

## 2013-04-19 ENCOUNTER — Ambulatory Visit: Payer: Self-pay | Admitting: Internal Medicine

## 2013-04-23 LAB — CANCER CENTER HEMOGLOBIN: HGB: 8.1 g/dL — ABNORMAL LOW (ref 13.0–18.0)

## 2013-04-28 LAB — CBC CANCER CENTER
Basophil #: 0.1 x10 3/mm (ref 0.0–0.1)
Eosinophil #: 0.1 x10 3/mm (ref 0.0–0.7)
Eosinophil %: 2.8 %
HCT: 28.9 % — ABNORMAL LOW (ref 40.0–52.0)
HGB: 9.1 g/dL — ABNORMAL LOW (ref 13.0–18.0)
Lymphocyte #: 0.9 x10 3/mm — ABNORMAL LOW (ref 1.0–3.6)
Lymphocyte %: 22.8 %
Monocyte #: 0.2 x10 3/mm (ref 0.2–1.0)
Monocyte %: 5.8 %
Neutrophil #: 2.8 x10 3/mm (ref 1.4–6.5)
Neutrophil %: 67.2 %
RBC: 3.56 10*6/uL — ABNORMAL LOW (ref 4.40–5.90)

## 2013-05-02 ENCOUNTER — Ambulatory Visit: Payer: Self-pay | Admitting: Unknown Physician Specialty

## 2013-05-02 LAB — CANCER CENTER HEMOGLOBIN: HGB: 8 g/dL — ABNORMAL LOW (ref 13.0–18.0)

## 2013-05-02 LAB — HEMOGLOBIN: HGB: 7.5 g/dL — ABNORMAL LOW (ref 13.0–18.0)

## 2013-05-05 LAB — CANCER CENTER HEMOGLOBIN: HGB: 9.9 g/dL — ABNORMAL LOW (ref 13.0–18.0)

## 2013-05-09 LAB — CANCER CENTER HEMOGLOBIN: HGB: 10 g/dL — ABNORMAL LOW (ref 13.0–18.0)

## 2013-05-19 ENCOUNTER — Ambulatory Visit: Payer: Self-pay | Admitting: Internal Medicine

## 2013-05-29 LAB — FERRITIN: Ferritin (ARMC): 63 ng/mL (ref 8–388)

## 2013-05-30 ENCOUNTER — Ambulatory Visit: Payer: Self-pay | Admitting: Unknown Physician Specialty

## 2013-06-16 LAB — FERRITIN: Ferritin (ARMC): 24 ng/mL (ref 8–388)

## 2013-06-16 LAB — CANCER CENTER HEMOGLOBIN: HGB: 9.8 g/dL — ABNORMAL LOW (ref 13.0–18.0)

## 2013-06-19 ENCOUNTER — Ambulatory Visit: Payer: Self-pay | Admitting: Internal Medicine

## 2013-06-24 ENCOUNTER — Ambulatory Visit: Payer: Self-pay | Admitting: Unknown Physician Specialty

## 2013-06-24 LAB — BODY FLUID CELL COUNT WITH DIFFERENTIAL
Basophil: 0 %
EOS PCT: 0 %
Lymphocytes: 54 %
NUCLEATED CELL COUNT: 300 /mm3
Neutrophils: 2 %
OTHER MONONUCLEAR CELLS: 44 %
Other Cells BF: 0 %

## 2013-06-24 LAB — ALBUMIN, FLUID (OTHER)

## 2013-06-24 LAB — PLATELET COUNT: PLATELETS: 66 10*3/uL — AB (ref 150–440)

## 2013-06-30 ENCOUNTER — Ambulatory Visit: Payer: Self-pay | Admitting: Internal Medicine

## 2013-06-30 LAB — BASIC METABOLIC PANEL
Anion Gap: 7 (ref 7–16)
BUN: 16 mg/dL (ref 7–18)
CHLORIDE: 109 mmol/L — AB (ref 98–107)
CREATININE: 0.91 mg/dL (ref 0.60–1.30)
Calcium, Total: 8.1 mg/dL — ABNORMAL LOW (ref 8.5–10.1)
Co2: 26 mmol/L (ref 21–32)
EGFR (African American): >60
EGFR (Non-African Amer.): >60
Glucose: 228 mg/dL — ABNORMAL HIGH (ref 65–99)
Osmolality: 292 (ref 275–301)
Potassium: 4.1 mmol/L (ref 3.5–5.1)
SODIUM: 142 mmol/L (ref 136–145)

## 2013-06-30 LAB — CANCER CTR PLATELET CT: Platelet: 70 x10 3/mm — ABNORMAL LOW (ref 150–440)

## 2013-06-30 LAB — CANCER CENTER HEMOGLOBIN: HGB: 9.7 g/dL — ABNORMAL LOW (ref 13.0–18.0)

## 2013-07-01 ENCOUNTER — Ambulatory Visit: Payer: Self-pay | Admitting: Unknown Physician Specialty

## 2013-07-09 ENCOUNTER — Ambulatory Visit: Payer: Self-pay | Admitting: Unknown Physician Specialty

## 2013-07-10 ENCOUNTER — Emergency Department: Payer: Self-pay | Admitting: Emergency Medicine

## 2013-07-10 LAB — CBC
HCT: 28.4 % — ABNORMAL LOW (ref 40.0–52.0)
HGB: 9 g/dL — ABNORMAL LOW (ref 13.0–18.0)
MCH: 23.3 pg — ABNORMAL LOW (ref 26.0–34.0)
MCHC: 31.6 g/dL — ABNORMAL LOW (ref 32.0–36.0)
MCV: 74 fL — ABNORMAL LOW (ref 80–100)
Platelet: 68 10*3/uL — ABNORMAL LOW (ref 150–440)
RBC: 3.85 10*6/uL — ABNORMAL LOW (ref 4.40–5.90)
RDW: 19.2 % — ABNORMAL HIGH (ref 11.5–14.5)
WBC: 3.9 10*3/uL (ref 3.8–10.6)

## 2013-07-10 LAB — BASIC METABOLIC PANEL
Anion Gap: 6 — ABNORMAL LOW (ref 7–16)
BUN: 18 mg/dL (ref 7–18)
CO2: 26 mmol/L (ref 21–32)
CREATININE: 0.92 mg/dL (ref 0.60–1.30)
Calcium, Total: 8 mg/dL — ABNORMAL LOW (ref 8.5–10.1)
Chloride: 106 mmol/L (ref 98–107)
EGFR (African American): >60
EGFR (Non-African Amer.): >60
Glucose: 309 mg/dL — ABNORMAL HIGH (ref 65–99)
OSMOLALITY: 289 (ref 275–301)
POTASSIUM: 3.9 mmol/L (ref 3.5–5.1)
SODIUM: 138 mmol/L (ref 136–145)

## 2013-07-10 LAB — DIFFERENTIAL
BASOS ABS: 0 10*3/uL (ref 0.0–0.1)
Basophil %: 0.4 %
EOS ABS: 0.1 10*3/uL (ref 0.0–0.7)
Eosinophil %: 3.3 %
LYMPHS PCT: 23.4 %
Lymphocyte #: 0.9 10*3/uL — ABNORMAL LOW (ref 1.0–3.6)
Monocyte #: 0.4 x10 3/mm (ref 0.2–1.0)
Monocyte %: 9.4 %
Neutrophil #: 2.5 10*3/uL (ref 1.4–6.5)
Neutrophil %: 63.5 %

## 2013-07-10 LAB — PRO B NATRIURETIC PEPTIDE: B-Type Natriuretic Peptide: 168 pg/mL — ABNORMAL HIGH (ref 0–125)

## 2013-07-15 ENCOUNTER — Ambulatory Visit: Payer: Self-pay | Admitting: Internal Medicine

## 2013-07-20 ENCOUNTER — Ambulatory Visit: Payer: Self-pay | Admitting: Internal Medicine

## 2013-07-23 ENCOUNTER — Ambulatory Visit: Payer: Self-pay | Admitting: Unknown Physician Specialty

## 2013-07-23 LAB — CBC WITH DIFFERENTIAL/PLATELET
Basophil #: 0.1 10*3/uL (ref 0.0–0.1)
Basophil %: 1.4 %
Eosinophil #: 0.2 10*3/uL (ref 0.0–0.7)
Eosinophil %: 4.5 %
HCT: 30.1 % — AB (ref 40.0–52.0)
HGB: 9.4 g/dL — ABNORMAL LOW (ref 13.0–18.0)
LYMPHS PCT: 22.7 %
Lymphocyte #: 1 10*3/uL (ref 1.0–3.6)
MCH: 22.3 pg — ABNORMAL LOW (ref 26.0–34.0)
MCHC: 31.2 g/dL — ABNORMAL LOW (ref 32.0–36.0)
MCV: 71 fL — ABNORMAL LOW (ref 80–100)
MONOS PCT: 7.1 %
Monocyte #: 0.3 x10 3/mm (ref 0.2–1.0)
NEUTROS ABS: 2.8 10*3/uL (ref 1.4–6.5)
Neutrophil %: 64.3 %
PLATELETS: 85 10*3/uL — AB (ref 150–440)
RBC: 4.22 10*6/uL — ABNORMAL LOW (ref 4.40–5.90)
RDW: 18.6 % — AB (ref 11.5–14.5)
WBC: 4.3 10*3/uL (ref 3.8–10.6)

## 2013-07-23 LAB — HEPATIC FUNCTION PANEL A (ARMC)
ALBUMIN: 1.7 g/dL — AB (ref 3.4–5.0)
ALT: 10 U/L — AB (ref 12–78)
Alkaline Phosphatase: 101 U/L
Bilirubin, Direct: 0.3 mg/dL — ABNORMAL HIGH (ref 0.00–0.20)
Bilirubin,Total: 0.9 mg/dL (ref 0.2–1.0)
SGOT(AST): 35 U/L (ref 15–37)
Total Protein: 6.2 g/dL — ABNORMAL LOW (ref 6.4–8.2)

## 2013-07-23 LAB — BASIC METABOLIC PANEL
Anion Gap: 4 — ABNORMAL LOW (ref 7–16)
BUN: 15 mg/dL (ref 7–18)
CALCIUM: 8.3 mg/dL — AB (ref 8.5–10.1)
CHLORIDE: 105 mmol/L (ref 98–107)
CO2: 29 mmol/L (ref 21–32)
CREATININE: 0.91 mg/dL (ref 0.60–1.30)
Glucose: 230 mg/dL — ABNORMAL HIGH (ref 65–99)
Osmolality: 284 (ref 275–301)
Potassium: 4 mmol/L (ref 3.5–5.1)
Sodium: 138 mmol/L (ref 136–145)

## 2013-07-23 LAB — PROTIME-INR
INR: 1.3
PROTHROMBIN TIME: 16 s — AB (ref 11.5–14.7)

## 2013-07-25 ENCOUNTER — Ambulatory Visit: Payer: Self-pay | Admitting: Internal Medicine

## 2013-07-25 LAB — FERRITIN: Ferritin (ARMC): 18 ng/mL (ref 8–388)

## 2013-07-25 LAB — CANCER CENTER HEMOGLOBIN: HGB: 9.1 g/dL — ABNORMAL LOW (ref 13.0–18.0)

## 2013-08-01 LAB — CANCER CENTER HEMOGLOBIN: HGB: 9.7 g/dL — ABNORMAL LOW (ref 13.0–18.0)

## 2013-08-11 ENCOUNTER — Ambulatory Visit: Payer: Self-pay | Admitting: Unknown Physician Specialty

## 2013-08-11 LAB — PLATELET COUNT: Platelet: 56 10*3/uL — ABNORMAL LOW (ref 150–440)

## 2013-08-17 ENCOUNTER — Ambulatory Visit: Payer: Self-pay | Admitting: Internal Medicine

## 2013-08-22 ENCOUNTER — Ambulatory Visit: Payer: Self-pay | Admitting: Unknown Physician Specialty

## 2013-08-22 LAB — CBC WITH DIFFERENTIAL/PLATELET
Basophil #: 0 10*3/uL (ref 0.0–0.1)
Basophil %: 1.3 %
Eosinophil #: 0.1 10*3/uL (ref 0.0–0.7)
Eosinophil %: 4.2 %
HCT: 27.8 % — ABNORMAL LOW (ref 40.0–52.0)
HGB: 8.9 g/dL — AB (ref 13.0–18.0)
Lymphocyte #: 0.8 10*3/uL — ABNORMAL LOW (ref 1.0–3.6)
Lymphocyte %: 26.3 %
MCH: 22.5 pg — ABNORMAL LOW (ref 26.0–34.0)
MCHC: 32 g/dL (ref 32.0–36.0)
MCV: 70 fL — AB (ref 80–100)
Monocyte #: 0.3 x10 3/mm (ref 0.2–1.0)
Monocyte %: 9.4 %
NEUTROS PCT: 58.8 %
Neutrophil #: 1.8 10*3/uL (ref 1.4–6.5)
Platelet: 60 10*3/uL — ABNORMAL LOW (ref 150–440)
RBC: 3.96 10*6/uL — AB (ref 4.40–5.90)
RDW: 19.9 % — ABNORMAL HIGH (ref 11.5–14.5)
WBC: 3.1 10*3/uL — AB (ref 3.8–10.6)

## 2013-08-22 LAB — BASIC METABOLIC PANEL
Anion Gap: 3 — ABNORMAL LOW (ref 7–16)
BUN: 19 mg/dL — AB (ref 7–18)
CHLORIDE: 107 mmol/L (ref 98–107)
Calcium, Total: 8.4 mg/dL — ABNORMAL LOW (ref 8.5–10.1)
Co2: 29 mmol/L (ref 21–32)
Creatinine: 0.8 mg/dL (ref 0.60–1.30)
EGFR (Non-African Amer.): >60
Glucose: 292 mg/dL — ABNORMAL HIGH (ref 65–99)
Osmolality: 291 (ref 275–301)
POTASSIUM: 4.3 mmol/L (ref 3.5–5.1)
Sodium: 139 mmol/L (ref 136–145)

## 2013-08-22 LAB — PROTIME-INR
INR: 1.3
Prothrombin Time: 16 secs — ABNORMAL HIGH (ref 11.5–14.7)

## 2013-08-22 LAB — APTT: Activated PTT: 39.3 secs — ABNORMAL HIGH (ref 23.6–35.9)

## 2013-08-27 ENCOUNTER — Ambulatory Visit: Payer: Self-pay | Admitting: Internal Medicine

## 2013-08-27 LAB — CANCER CENTER HEMOGLOBIN: HGB: 8.6 g/dL — ABNORMAL LOW (ref 13.0–18.0)

## 2013-08-27 LAB — FERRITIN: FERRITIN (ARMC): 17 ng/mL (ref 8–388)

## 2013-09-03 ENCOUNTER — Ambulatory Visit: Payer: Self-pay | Admitting: Unknown Physician Specialty

## 2013-09-03 LAB — CBC WITH DIFFERENTIAL/PLATELET
BASOS ABS: 0.1 10*3/uL (ref 0.0–0.1)
Basophil %: 1.4 %
EOS ABS: 0.2 10*3/uL (ref 0.0–0.7)
Eosinophil %: 4.8 %
HCT: 30.1 % — ABNORMAL LOW (ref 40.0–52.0)
HGB: 9.6 g/dL — ABNORMAL LOW (ref 13.0–18.0)
Lymphocyte #: 1 10*3/uL (ref 1.0–3.6)
Lymphocyte %: 23.6 %
MCH: 23.2 pg — ABNORMAL LOW (ref 26.0–34.0)
MCHC: 31.9 g/dL — AB (ref 32.0–36.0)
MCV: 73 fL — ABNORMAL LOW (ref 80–100)
MONOS PCT: 9.4 %
Monocyte #: 0.4 x10 3/mm (ref 0.2–1.0)
Neutrophil #: 2.6 10*3/uL (ref 1.4–6.5)
Neutrophil %: 60.8 %
PLATELETS: 69 10*3/uL — AB (ref 150–440)
RBC: 4.13 10*6/uL — AB (ref 4.40–5.90)
RDW: 23.4 % — ABNORMAL HIGH (ref 11.5–14.5)
WBC: 4.3 10*3/uL (ref 3.8–10.6)

## 2013-09-10 LAB — COMPREHENSIVE METABOLIC PANEL
ALBUMIN: 1.6 g/dL — AB (ref 3.4–5.0)
ALK PHOS: 139 U/L — AB
ANION GAP: 6 — AB (ref 7–16)
BILIRUBIN TOTAL: 0.6 mg/dL (ref 0.2–1.0)
BUN: 17 mg/dL (ref 7–18)
CHLORIDE: 107 mmol/L (ref 98–107)
Calcium, Total: 7.9 mg/dL — ABNORMAL LOW (ref 8.5–10.1)
Co2: 25 mmol/L (ref 21–32)
Creatinine: 0.85 mg/dL (ref 0.60–1.30)
EGFR (African American): >60
EGFR (Non-African Amer.): >60
GLUCOSE: 234 mg/dL — AB (ref 65–99)
Osmolality: 285 (ref 275–301)
Potassium: 3.9 mmol/L (ref 3.5–5.1)
SGOT(AST): 34 U/L (ref 15–37)
SGPT (ALT): 7 U/L — ABNORMAL LOW (ref 12–78)
Sodium: 138 mmol/L (ref 136–145)
TOTAL PROTEIN: 5.9 g/dL — AB (ref 6.4–8.2)

## 2013-09-10 LAB — CBC CANCER CENTER
Basophil #: 0 x10 3/mm (ref 0.0–0.1)
Basophil %: 1.4 %
EOS PCT: 5.2 %
Eosinophil #: 0.2 x10 3/mm (ref 0.0–0.7)
HCT: 31 % — ABNORMAL LOW (ref 40.0–52.0)
HGB: 9.6 g/dL — AB (ref 13.0–18.0)
LYMPHS PCT: 25.9 %
Lymphocyte #: 0.9 x10 3/mm — ABNORMAL LOW (ref 1.0–3.6)
MCH: 22.2 pg — ABNORMAL LOW (ref 26.0–34.0)
MCHC: 31.1 g/dL — AB (ref 32.0–36.0)
MCV: 72 fL — AB (ref 80–100)
Monocyte #: 0.3 x10 3/mm (ref 0.2–1.0)
Monocyte %: 8.6 %
NEUTROS ABS: 2 x10 3/mm (ref 1.4–6.5)
Neutrophil %: 58.9 %
Platelet: 73 x10 3/mm — ABNORMAL LOW (ref 150–440)
RBC: 4.34 10*6/uL — ABNORMAL LOW (ref 4.40–5.90)
RDW: 22.3 % — AB (ref 11.5–14.5)
WBC: 3.4 x10 3/mm — ABNORMAL LOW (ref 3.8–10.6)

## 2013-09-12 ENCOUNTER — Ambulatory Visit: Payer: Self-pay | Admitting: Unknown Physician Specialty

## 2013-09-12 LAB — CBC WITH DIFFERENTIAL/PLATELET
BASOS ABS: 0.1 10*3/uL (ref 0.0–0.1)
Basophil %: 1.8 %
EOS PCT: 4 %
Eosinophil #: 0.2 10*3/uL (ref 0.0–0.7)
HCT: 30.8 % — ABNORMAL LOW (ref 40.0–52.0)
HGB: 9.7 g/dL — AB (ref 13.0–18.0)
LYMPHS ABS: 0.9 10*3/uL — AB (ref 1.0–3.6)
LYMPHS PCT: 24 %
MCH: 22.6 pg — AB (ref 26.0–34.0)
MCHC: 31.3 g/dL — ABNORMAL LOW (ref 32.0–36.0)
MCV: 72 fL — ABNORMAL LOW (ref 80–100)
MONO ABS: 0.4 x10 3/mm (ref 0.2–1.0)
Monocyte %: 10.4 %
NEUTROS PCT: 59.8 %
Neutrophil #: 2.3 10*3/uL (ref 1.4–6.5)
Platelet: 79 10*3/uL — ABNORMAL LOW (ref 150–440)
RBC: 4.28 10*6/uL — AB (ref 4.40–5.90)
RDW: 21.7 % — ABNORMAL HIGH (ref 11.5–14.5)
WBC: 3.8 10*3/uL (ref 3.8–10.6)

## 2013-09-17 ENCOUNTER — Ambulatory Visit: Payer: Self-pay | Admitting: Internal Medicine

## 2013-09-19 ENCOUNTER — Ambulatory Visit: Payer: Self-pay | Admitting: Unknown Physician Specialty

## 2013-09-19 LAB — CBC WITH DIFFERENTIAL/PLATELET
Basophil #: 0.1 10*3/uL (ref 0.0–0.1)
Basophil %: 1.8 %
EOS ABS: 0.3 10*3/uL (ref 0.0–0.7)
EOS PCT: 6.3 %
HCT: 30.6 % — AB (ref 40.0–52.0)
HGB: 9.5 g/dL — ABNORMAL LOW (ref 13.0–18.0)
LYMPHS PCT: 29.4 %
Lymphocyte #: 1.6 10*3/uL (ref 1.0–3.6)
MCH: 22 pg — AB (ref 26.0–34.0)
MCHC: 31 g/dL — AB (ref 32.0–36.0)
MCV: 71 fL — ABNORMAL LOW (ref 80–100)
Monocyte #: 0.5 x10 3/mm (ref 0.2–1.0)
Monocyte %: 9.5 %
NEUTROS ABS: 2.9 10*3/uL (ref 1.4–6.5)
Neutrophil %: 53 %
PLATELETS: 87 10*3/uL — AB (ref 150–440)
RBC: 4.31 10*6/uL — AB (ref 4.40–5.90)
RDW: 20.8 % — ABNORMAL HIGH (ref 11.5–14.5)
WBC: 5.4 10*3/uL (ref 3.8–10.6)

## 2013-09-26 ENCOUNTER — Ambulatory Visit: Payer: Self-pay | Admitting: Unknown Physician Specialty

## 2013-09-26 LAB — CBC WITH DIFFERENTIAL/PLATELET
BASOS ABS: 0.1 10*3/uL (ref 0.0–0.1)
Basophil %: 1.7 %
EOS ABS: 0.3 10*3/uL (ref 0.0–0.7)
EOS PCT: 6.4 %
HCT: 29.5 % — ABNORMAL LOW (ref 40.0–52.0)
HGB: 9.2 g/dL — ABNORMAL LOW (ref 13.0–18.0)
LYMPHS ABS: 1.1 10*3/uL (ref 1.0–3.6)
LYMPHS PCT: 27.1 %
MCH: 22.3 pg — ABNORMAL LOW (ref 26.0–34.0)
MCHC: 31.3 g/dL — AB (ref 32.0–36.0)
MCV: 71 fL — ABNORMAL LOW (ref 80–100)
MONO ABS: 0.4 x10 3/mm (ref 0.2–1.0)
Monocyte %: 10.5 %
NEUTROS ABS: 2.2 10*3/uL (ref 1.4–6.5)
Neutrophil %: 54.3 %
Platelet: 70 10*3/uL — ABNORMAL LOW (ref 150–440)
RBC: 4.14 10*6/uL — ABNORMAL LOW (ref 4.40–5.90)
RDW: 20.9 % — ABNORMAL HIGH (ref 11.5–14.5)
WBC: 4 10*3/uL (ref 3.8–10.6)

## 2013-10-03 ENCOUNTER — Ambulatory Visit: Payer: Self-pay | Admitting: Unknown Physician Specialty

## 2013-10-03 LAB — CBC WITH DIFFERENTIAL/PLATELET
BASOS ABS: 0 10*3/uL (ref 0.0–0.1)
BASOS PCT: 0.9 %
Eosinophil #: 0.2 10*3/uL (ref 0.0–0.7)
Eosinophil %: 4.7 %
HCT: 29.5 % — AB (ref 40.0–52.0)
HGB: 9.3 g/dL — ABNORMAL LOW (ref 13.0–18.0)
LYMPHS ABS: 0.9 10*3/uL — AB (ref 1.0–3.6)
LYMPHS PCT: 24.3 %
MCH: 22.5 pg — AB (ref 26.0–34.0)
MCHC: 31.6 g/dL — ABNORMAL LOW (ref 32.0–36.0)
MCV: 71 fL — ABNORMAL LOW (ref 80–100)
Monocyte #: 0.3 x10 3/mm (ref 0.2–1.0)
Monocyte %: 9.5 %
NEUTROS ABS: 2.2 10*3/uL (ref 1.4–6.5)
Neutrophil %: 60.6 %
Platelet: 65 10*3/uL — ABNORMAL LOW (ref 150–440)
RBC: 4.14 10*6/uL — ABNORMAL LOW (ref 4.40–5.90)
RDW: 20.6 % — AB (ref 11.5–14.5)
WBC: 3.6 10*3/uL — AB (ref 3.8–10.6)

## 2013-10-08 ENCOUNTER — Ambulatory Visit: Payer: Self-pay | Admitting: Internal Medicine

## 2013-10-08 LAB — CANCER CENTER HEMOGLOBIN: HGB: 9.6 g/dL — ABNORMAL LOW (ref 13.0–18.0)

## 2013-10-08 LAB — FERRITIN: FERRITIN (ARMC): 15 ng/mL (ref 8–388)

## 2013-10-10 ENCOUNTER — Ambulatory Visit: Payer: Self-pay | Admitting: Unknown Physician Specialty

## 2013-10-10 LAB — CBC WITH DIFFERENTIAL/PLATELET
Basophil #: 0.1 10*3/uL (ref 0.0–0.1)
Basophil %: 2.3 %
EOS ABS: 0.4 10*3/uL (ref 0.0–0.7)
Eosinophil %: 7.4 %
HCT: 30.6 % — ABNORMAL LOW (ref 40.0–52.0)
HGB: 9.8 g/dL — ABNORMAL LOW (ref 13.0–18.0)
Lymphocyte #: 1.8 10*3/uL (ref 1.0–3.6)
Lymphocyte %: 31.1 %
MCH: 22.5 pg — ABNORMAL LOW (ref 26.0–34.0)
MCHC: 32 g/dL (ref 32.0–36.0)
MCV: 70 fL — ABNORMAL LOW (ref 80–100)
Monocyte #: 0.6 x10 3/mm (ref 0.2–1.0)
Monocyte %: 10.5 %
NEUTROS ABS: 2.8 10*3/uL (ref 1.4–6.5)
Neutrophil %: 48.7 %
Platelet: 117 10*3/uL — ABNORMAL LOW (ref 150–440)
RBC: 4.37 10*6/uL — ABNORMAL LOW (ref 4.40–5.90)
RDW: 19.4 % — ABNORMAL HIGH (ref 11.5–14.5)
WBC: 5.8 10*3/uL (ref 3.8–10.6)

## 2013-10-17 ENCOUNTER — Ambulatory Visit: Payer: Self-pay | Admitting: Unknown Physician Specialty

## 2013-10-17 ENCOUNTER — Ambulatory Visit: Payer: Self-pay | Admitting: Internal Medicine

## 2013-10-17 LAB — CBC WITH DIFFERENTIAL/PLATELET
Basophil #: 0.1 10*3/uL (ref 0.0–0.1)
Basophil %: 1.8 %
EOS ABS: 0.2 10*3/uL (ref 0.0–0.7)
EOS PCT: 4.4 %
HCT: 32.4 % — ABNORMAL LOW (ref 40.0–52.0)
HGB: 10.4 g/dL — ABNORMAL LOW (ref 13.0–18.0)
LYMPHS ABS: 1.2 10*3/uL (ref 1.0–3.6)
LYMPHS PCT: 28.5 %
MCH: 23.5 pg — AB (ref 26.0–34.0)
MCHC: 32 g/dL (ref 32.0–36.0)
MCV: 73 fL — ABNORMAL LOW (ref 80–100)
Monocyte #: 0.4 x10 3/mm (ref 0.2–1.0)
Monocyte %: 9.3 %
Neutrophil #: 2.4 10*3/uL (ref 1.4–6.5)
Neutrophil %: 56 %
Platelet: 62 10*3/uL — ABNORMAL LOW (ref 150–440)
RBC: 4.42 10*6/uL (ref 4.40–5.90)
RDW: 21.5 % — ABNORMAL HIGH (ref 11.5–14.5)
WBC: 4.4 10*3/uL (ref 3.8–10.6)

## 2013-10-24 ENCOUNTER — Ambulatory Visit: Payer: Self-pay | Admitting: Unknown Physician Specialty

## 2013-10-24 LAB — CBC WITH DIFFERENTIAL/PLATELET
BASOS PCT: 1.5 %
Basophil #: 0.1 10*3/uL (ref 0.0–0.1)
Eosinophil #: 0.2 10*3/uL (ref 0.0–0.7)
Eosinophil %: 5.9 %
HCT: 33.4 % — ABNORMAL LOW (ref 40.0–52.0)
HGB: 10.6 g/dL — ABNORMAL LOW (ref 13.0–18.0)
Lymphocyte #: 1.1 10*3/uL (ref 1.0–3.6)
Lymphocyte %: 26 %
MCH: 23.7 pg — ABNORMAL LOW (ref 26.0–34.0)
MCHC: 31.7 g/dL — ABNORMAL LOW (ref 32.0–36.0)
MCV: 75 fL — ABNORMAL LOW (ref 80–100)
MONO ABS: 0.3 x10 3/mm (ref 0.2–1.0)
Monocyte %: 8.4 %
Neutrophil #: 2.4 10*3/uL (ref 1.4–6.5)
Neutrophil %: 58.2 %
Platelet: 67 10*3/uL — ABNORMAL LOW (ref 150–440)
RBC: 4.48 10*6/uL (ref 4.40–5.90)
RDW: 21.6 % — ABNORMAL HIGH (ref 11.5–14.5)
WBC: 4 10*3/uL (ref 3.8–10.6)

## 2013-10-31 ENCOUNTER — Ambulatory Visit: Payer: Self-pay | Admitting: Unknown Physician Specialty

## 2013-10-31 LAB — CBC WITH DIFFERENTIAL/PLATELET
BASOS ABS: 0.1 10*3/uL (ref 0.0–0.1)
BASOS PCT: 1.3 %
EOS ABS: 0.3 10*3/uL (ref 0.0–0.7)
Eosinophil %: 6.6 %
HCT: 32.6 % — ABNORMAL LOW (ref 40.0–52.0)
HGB: 10.7 g/dL — AB (ref 13.0–18.0)
LYMPHS PCT: 25.4 %
Lymphocyte #: 1.2 10*3/uL (ref 1.0–3.6)
MCH: 24.1 pg — AB (ref 26.0–34.0)
MCHC: 32.8 g/dL (ref 32.0–36.0)
MCV: 74 fL — ABNORMAL LOW (ref 80–100)
MONO ABS: 0.4 x10 3/mm (ref 0.2–1.0)
Monocyte %: 9 %
NEUTROS ABS: 2.8 10*3/uL (ref 1.4–6.5)
Neutrophil %: 57.7 %
Platelet: 71 10*3/uL — ABNORMAL LOW (ref 150–440)
RBC: 4.44 10*6/uL (ref 4.40–5.90)
RDW: 21.9 % — AB (ref 11.5–14.5)
WBC: 4.9 10*3/uL (ref 3.8–10.6)

## 2013-11-07 ENCOUNTER — Ambulatory Visit: Payer: Self-pay | Admitting: Unknown Physician Specialty

## 2013-11-07 LAB — CBC WITH DIFFERENTIAL/PLATELET
Basophil #: 0 10*3/uL (ref 0.0–0.1)
Basophil %: 1.3 %
Eosinophil #: 0.2 10*3/uL (ref 0.0–0.7)
Eosinophil %: 4.8 %
HCT: 32.7 % — AB (ref 40.0–52.0)
HGB: 10.7 g/dL — ABNORMAL LOW (ref 13.0–18.0)
LYMPHS PCT: 28.3 %
Lymphocyte #: 1 10*3/uL (ref 1.0–3.6)
MCH: 24.3 pg — ABNORMAL LOW (ref 26.0–34.0)
MCHC: 32.7 g/dL (ref 32.0–36.0)
MCV: 74 fL — AB (ref 80–100)
MONO ABS: 0.3 x10 3/mm (ref 0.2–1.0)
Monocyte %: 9.2 %
NEUTROS ABS: 2 10*3/uL (ref 1.4–6.5)
Neutrophil %: 56.4 %
Platelet: 60 10*3/uL — ABNORMAL LOW (ref 150–440)
RBC: 4.41 10*6/uL (ref 4.40–5.90)
RDW: 21.8 % — AB (ref 11.5–14.5)
WBC: 3.6 10*3/uL — ABNORMAL LOW (ref 3.8–10.6)

## 2013-11-13 ENCOUNTER — Ambulatory Visit: Payer: Self-pay | Admitting: Internal Medicine

## 2013-11-13 LAB — AMMONIA: AMMONIA, PLASMA: 58 umol/L — AB (ref 11–32)

## 2013-11-13 LAB — FERRITIN: Ferritin (ARMC): 25 ng/mL (ref 8–388)

## 2013-11-13 LAB — CANCER CENTER HEMOGLOBIN: HGB: 10.7 g/dL — AB (ref 13.0–18.0)

## 2013-11-17 ENCOUNTER — Ambulatory Visit: Payer: Self-pay | Admitting: Internal Medicine

## 2013-11-17 ENCOUNTER — Ambulatory Visit: Payer: Self-pay | Admitting: Unknown Physician Specialty

## 2013-11-17 LAB — CBC WITH DIFFERENTIAL/PLATELET
BASOS ABS: 0 10*3/uL (ref 0.0–0.1)
BASOS PCT: 1.2 %
EOS PCT: 5.5 %
Eosinophil #: 0.2 10*3/uL (ref 0.0–0.7)
HCT: 33 % — AB (ref 40.0–52.0)
HGB: 10.5 g/dL — ABNORMAL LOW (ref 13.0–18.0)
LYMPHS PCT: 30.2 %
Lymphocyte #: 1.1 10*3/uL (ref 1.0–3.6)
MCH: 24.1 pg — ABNORMAL LOW (ref 26.0–34.0)
MCHC: 31.7 g/dL — AB (ref 32.0–36.0)
MCV: 76 fL — ABNORMAL LOW (ref 80–100)
Monocyte #: 0.3 x10 3/mm (ref 0.2–1.0)
Monocyte %: 9 %
NEUTROS ABS: 1.9 10*3/uL (ref 1.4–6.5)
NEUTROS PCT: 54.1 %
Platelet: 62 10*3/uL — ABNORMAL LOW (ref 150–440)
RBC: 4.33 10*6/uL — ABNORMAL LOW (ref 4.40–5.90)
RDW: 21.8 % — ABNORMAL HIGH (ref 11.5–14.5)
WBC: 3.6 10*3/uL — AB (ref 3.8–10.6)

## 2013-11-21 ENCOUNTER — Ambulatory Visit: Payer: Self-pay | Admitting: Unknown Physician Specialty

## 2013-11-21 LAB — CBC WITH DIFFERENTIAL/PLATELET
BASOS PCT: 1.4 %
Basophil #: 0.1 10*3/uL (ref 0.0–0.1)
Eosinophil #: 0.2 10*3/uL (ref 0.0–0.7)
Eosinophil %: 5.5 %
HCT: 35.3 % — AB (ref 40.0–52.0)
HGB: 11.4 g/dL — AB (ref 13.0–18.0)
LYMPHS PCT: 26.9 %
Lymphocyte #: 1.2 10*3/uL (ref 1.0–3.6)
MCH: 24.7 pg — ABNORMAL LOW (ref 26.0–34.0)
MCHC: 32.5 g/dL (ref 32.0–36.0)
MCV: 76 fL — ABNORMAL LOW (ref 80–100)
MONO ABS: 0.3 x10 3/mm (ref 0.2–1.0)
Monocyte %: 7.9 %
Neutrophil #: 2.6 10*3/uL (ref 1.4–6.5)
Neutrophil %: 58.3 %
Platelet: 65 10*3/uL — ABNORMAL LOW (ref 150–440)
RBC: 4.63 10*6/uL (ref 4.40–5.90)
RDW: 22.6 % — AB (ref 11.5–14.5)
WBC: 4.4 10*3/uL (ref 3.8–10.6)

## 2013-11-21 LAB — COMPREHENSIVE METABOLIC PANEL
ALBUMIN: 1.7 g/dL — AB (ref 3.4–5.0)
ALK PHOS: 135 U/L — AB
AST: 43 U/L — AB (ref 15–37)
Anion Gap: 5 — ABNORMAL LOW (ref 7–16)
BUN: 17 mg/dL (ref 7–18)
Bilirubin,Total: 0.9 mg/dL (ref 0.2–1.0)
CALCIUM: 7.8 mg/dL — AB (ref 8.5–10.1)
CO2: 26 mmol/L (ref 21–32)
Chloride: 107 mmol/L (ref 98–107)
Creatinine: 0.99 mg/dL (ref 0.60–1.30)
EGFR (Non-African Amer.): >60
GLUCOSE: 231 mg/dL — AB (ref 65–99)
Osmolality: 285 (ref 275–301)
Potassium: 4 mmol/L (ref 3.5–5.1)
SGPT (ALT): 10 U/L — ABNORMAL LOW (ref 12–78)
Sodium: 138 mmol/L (ref 136–145)
Total Protein: 5.5 g/dL — ABNORMAL LOW (ref 6.4–8.2)

## 2013-11-21 LAB — PROTIME-INR
INR: 1.3
PROTHROMBIN TIME: 16.3 s — AB (ref 11.5–14.7)

## 2013-11-21 LAB — AMMONIA: AMMONIA, PLASMA: 40 umol/L — AB (ref 11–32)

## 2013-11-28 ENCOUNTER — Ambulatory Visit: Payer: Self-pay | Admitting: Unknown Physician Specialty

## 2013-11-28 LAB — CBC WITH DIFFERENTIAL/PLATELET
Basophil #: 0.1 10*3/uL (ref 0.0–0.1)
Basophil %: 1.2 %
EOS ABS: 0.2 10*3/uL (ref 0.0–0.7)
EOS PCT: 3.7 %
HCT: 34.7 % — ABNORMAL LOW (ref 40.0–52.0)
HGB: 11.4 g/dL — ABNORMAL LOW (ref 13.0–18.0)
Lymphocyte #: 1.1 10*3/uL (ref 1.0–3.6)
Lymphocyte %: 25.3 %
MCH: 25.6 pg — ABNORMAL LOW (ref 26.0–34.0)
MCHC: 32.8 g/dL (ref 32.0–36.0)
MCV: 78 fL — ABNORMAL LOW (ref 80–100)
MONOS PCT: 9.7 %
Monocyte #: 0.4 x10 3/mm (ref 0.2–1.0)
Neutrophil #: 2.6 10*3/uL (ref 1.4–6.5)
Neutrophil %: 60.1 %
Platelet: 53 10*3/uL — ABNORMAL LOW (ref 150–440)
RBC: 4.45 10*6/uL (ref 4.40–5.90)
RDW: 21.8 % — AB (ref 11.5–14.5)
WBC: 4.4 10*3/uL (ref 3.8–10.6)

## 2013-12-04 ENCOUNTER — Ambulatory Visit: Payer: Self-pay | Admitting: Internal Medicine

## 2013-12-04 LAB — CANCER CENTER HEMOGLOBIN: HGB: 11.5 g/dL — AB (ref 13.0–18.0)

## 2013-12-04 LAB — FERRITIN: Ferritin (ARMC): 56 ng/mL (ref 8–388)

## 2013-12-05 ENCOUNTER — Ambulatory Visit: Payer: Self-pay | Admitting: Unknown Physician Specialty

## 2013-12-05 LAB — CBC WITH DIFFERENTIAL/PLATELET
BASOS ABS: 0.2 10*3/uL — AB (ref 0.0–0.1)
BASOS PCT: 3.7 %
EOS ABS: 0.3 10*3/uL (ref 0.0–0.7)
Eosinophil %: 5.6 %
HCT: 34.8 % — AB (ref 40.0–52.0)
HGB: 11.2 g/dL — ABNORMAL LOW (ref 13.0–18.0)
LYMPHS ABS: 0.7 10*3/uL — AB (ref 1.0–3.6)
LYMPHS PCT: 13.9 %
MCH: 25 pg — ABNORMAL LOW (ref 26.0–34.0)
MCHC: 32.2 g/dL (ref 32.0–36.0)
MCV: 78 fL — AB (ref 80–100)
Monocyte #: 0.4 x10 3/mm (ref 0.2–1.0)
Monocyte %: 7.3 %
NEUTROS ABS: 3.4 10*3/uL (ref 1.4–6.5)
Neutrophil %: 69.5 %
Platelet: 71 10*3/uL — ABNORMAL LOW (ref 150–440)
RBC: 4.48 10*6/uL (ref 4.40–5.90)
RDW: 20.5 % — ABNORMAL HIGH (ref 11.5–14.5)
WBC: 4.9 10*3/uL (ref 3.8–10.6)

## 2013-12-12 ENCOUNTER — Ambulatory Visit: Payer: Self-pay | Admitting: Unknown Physician Specialty

## 2013-12-12 LAB — CBC WITH DIFFERENTIAL/PLATELET
BASOS PCT: 1 %
Basophil #: 0.1 10*3/uL (ref 0.0–0.1)
Eosinophil #: 0.2 10*3/uL (ref 0.0–0.7)
Eosinophil %: 4.6 %
HCT: 36.4 % — ABNORMAL LOW (ref 40.0–52.0)
HGB: 12.3 g/dL — ABNORMAL LOW (ref 13.0–18.0)
LYMPHS ABS: 1.2 10*3/uL (ref 1.0–3.6)
Lymphocyte %: 22.9 %
MCH: 26.5 pg (ref 26.0–34.0)
MCHC: 33.7 g/dL (ref 32.0–36.0)
MCV: 79 fL — AB (ref 80–100)
Monocyte #: 0.4 x10 3/mm (ref 0.2–1.0)
Monocyte %: 7.4 %
Neutrophil #: 3.4 10*3/uL (ref 1.4–6.5)
Neutrophil %: 64.1 %
PLATELETS: 70 10*3/uL — AB (ref 150–440)
RBC: 4.64 10*6/uL (ref 4.40–5.90)
RDW: 20.5 % — ABNORMAL HIGH (ref 11.5–14.5)
WBC: 5.3 10*3/uL (ref 3.8–10.6)

## 2013-12-17 ENCOUNTER — Ambulatory Visit: Payer: Self-pay | Admitting: Internal Medicine

## 2013-12-22 ENCOUNTER — Ambulatory Visit: Payer: Self-pay | Admitting: Unknown Physician Specialty

## 2013-12-22 LAB — CBC WITH DIFFERENTIAL/PLATELET
BASOS ABS: 0.1 10*3/uL (ref 0.0–0.1)
Basophil %: 1.4 %
EOS PCT: 5 %
Eosinophil #: 0.3 10*3/uL (ref 0.0–0.7)
HCT: 34.6 % — AB (ref 40.0–52.0)
HGB: 11.6 g/dL — ABNORMAL LOW (ref 13.0–18.0)
Lymphocyte #: 1.2 10*3/uL (ref 1.0–3.6)
Lymphocyte %: 23.5 %
MCH: 27.2 pg (ref 26.0–34.0)
MCHC: 33.6 g/dL (ref 32.0–36.0)
MCV: 81 fL (ref 80–100)
Monocyte #: 0.4 x10 3/mm (ref 0.2–1.0)
Monocyte %: 6.9 %
NEUTROS ABS: 3.3 10*3/uL (ref 1.4–6.5)
Neutrophil %: 63.2 %
Platelet: 57 10*3/uL — ABNORMAL LOW (ref 150–440)
RBC: 4.28 10*6/uL — AB (ref 4.40–5.90)
RDW: 20.4 % — ABNORMAL HIGH (ref 11.5–14.5)
WBC: 5.2 10*3/uL (ref 3.8–10.6)

## 2013-12-26 ENCOUNTER — Ambulatory Visit: Payer: Self-pay | Admitting: Unknown Physician Specialty

## 2013-12-26 LAB — CBC WITH DIFFERENTIAL/PLATELET
BASOS PCT: 1 %
Basophil #: 0 10*3/uL (ref 0.0–0.1)
Eosinophil #: 0.2 10*3/uL (ref 0.0–0.7)
Eosinophil %: 4.1 %
HCT: 35.6 % — AB (ref 40.0–52.0)
HGB: 12 g/dL — AB (ref 13.0–18.0)
LYMPHS PCT: 24.5 %
Lymphocyte #: 1.2 10*3/uL (ref 1.0–3.6)
MCH: 26.9 pg (ref 26.0–34.0)
MCHC: 33.6 g/dL (ref 32.0–36.0)
MCV: 80 fL (ref 80–100)
Monocyte #: 0.4 x10 3/mm (ref 0.2–1.0)
Monocyte %: 8.9 %
Neutrophil #: 2.9 10*3/uL (ref 1.4–6.5)
Neutrophil %: 61.5 %
Platelet: 61 10*3/uL — ABNORMAL LOW (ref 150–440)
RBC: 4.45 10*6/uL (ref 4.40–5.90)
RDW: 19.9 % — ABNORMAL HIGH (ref 11.5–14.5)
WBC: 4.7 10*3/uL (ref 3.8–10.6)

## 2014-01-02 ENCOUNTER — Ambulatory Visit: Payer: Self-pay | Admitting: Unknown Physician Specialty

## 2014-01-02 LAB — CBC WITH DIFFERENTIAL/PLATELET
Basophil #: 0.1 10*3/uL (ref 0.0–0.1)
Basophil %: 2.2 %
Eosinophil #: 0.4 10*3/uL (ref 0.0–0.7)
Eosinophil %: 5.6 %
HCT: 36.9 % — ABNORMAL LOW (ref 40.0–52.0)
HGB: 12.4 g/dL — AB (ref 13.0–18.0)
LYMPHS PCT: 20 %
Lymphocyte #: 1.3 10*3/uL (ref 1.0–3.6)
MCH: 27.3 pg (ref 26.0–34.0)
MCHC: 33.6 g/dL (ref 32.0–36.0)
MCV: 81 fL (ref 80–100)
MONOS PCT: 7.9 %
Monocyte #: 0.5 x10 3/mm (ref 0.2–1.0)
Neutrophil #: 4 10*3/uL (ref 1.4–6.5)
Neutrophil %: 64.3 %
Platelet: 75 10*3/uL — ABNORMAL LOW (ref 150–440)
RBC: 4.55 10*6/uL (ref 4.40–5.90)
RDW: 19.1 % — AB (ref 11.5–14.5)
WBC: 6.3 10*3/uL (ref 3.8–10.6)

## 2014-01-07 ENCOUNTER — Ambulatory Visit: Payer: Self-pay | Admitting: Internal Medicine

## 2014-01-07 LAB — CANCER CENTER HEMOGLOBIN: HGB: 11.6 g/dL — ABNORMAL LOW (ref 13.0–18.0)

## 2014-01-07 LAB — FERRITIN: Ferritin (ARMC): 82 ng/mL (ref 8–388)

## 2014-01-09 ENCOUNTER — Ambulatory Visit: Payer: Self-pay | Admitting: Unknown Physician Specialty

## 2014-01-09 LAB — CBC WITH DIFFERENTIAL/PLATELET
Basophil #: 0.1 10*3/uL (ref 0.0–0.1)
Basophil %: 1.2 %
Eosinophil #: 0.2 10*3/uL (ref 0.0–0.7)
Eosinophil %: 4.9 %
HCT: 34.8 % — ABNORMAL LOW (ref 40.0–52.0)
HGB: 11.6 g/dL — ABNORMAL LOW (ref 13.0–18.0)
LYMPHS ABS: 1.2 10*3/uL (ref 1.0–3.6)
LYMPHS PCT: 25 %
MCH: 27.4 pg (ref 26.0–34.0)
MCHC: 33.4 g/dL (ref 32.0–36.0)
MCV: 82 fL (ref 80–100)
Monocyte #: 0.4 x10 3/mm (ref 0.2–1.0)
Monocyte %: 9 %
NEUTROS PCT: 59.9 %
Neutrophil #: 2.9 10*3/uL (ref 1.4–6.5)
PLATELETS: 68 10*3/uL — AB (ref 150–440)
RBC: 4.25 10*6/uL — AB (ref 4.40–5.90)
RDW: 18.7 % — ABNORMAL HIGH (ref 11.5–14.5)
WBC: 4.9 10*3/uL (ref 3.8–10.6)

## 2014-01-16 ENCOUNTER — Ambulatory Visit: Payer: Self-pay | Admitting: Unknown Physician Specialty

## 2014-01-16 LAB — CBC WITH DIFFERENTIAL/PLATELET
Basophil #: 0.1 10*3/uL (ref 0.0–0.1)
Basophil %: 1.2 %
Eosinophil #: 0.7 10*3/uL (ref 0.0–0.7)
Eosinophil %: 7.3 %
HCT: 35.4 % — ABNORMAL LOW (ref 40.0–52.0)
HGB: 12 g/dL — ABNORMAL LOW (ref 13.0–18.0)
Lymphocyte #: 2.5 10*3/uL (ref 1.0–3.6)
Lymphocyte %: 24.8 %
MCH: 27.6 pg (ref 26.0–34.0)
MCHC: 34 g/dL (ref 32.0–36.0)
MCV: 81 fL (ref 80–100)
Monocyte #: 1 x10 3/mm (ref 0.2–1.0)
Monocyte %: 10.1 %
Neutrophil #: 5.6 10*3/uL (ref 1.4–6.5)
Neutrophil %: 56.6 %
Platelet: 119 10*3/uL — ABNORMAL LOW (ref 150–440)
RBC: 4.35 10*6/uL — ABNORMAL LOW (ref 4.40–5.90)
RDW: 18.1 % — ABNORMAL HIGH (ref 11.5–14.5)
WBC: 9.9 10*3/uL (ref 3.8–10.6)

## 2014-01-17 ENCOUNTER — Ambulatory Visit: Payer: Self-pay | Admitting: Internal Medicine

## 2014-01-21 ENCOUNTER — Other Ambulatory Visit: Payer: Self-pay | Admitting: *Deleted

## 2014-01-23 ENCOUNTER — Other Ambulatory Visit: Payer: Self-pay | Admitting: *Deleted

## 2014-01-23 MED ORDER — OMEPRAZOLE 40 MG PO CPDR: 40.0000 mg | DELAYED_RELEASE_CAPSULE | Freq: Two times a day (BID) | ORAL | Status: AC

## 2014-01-30 ENCOUNTER — Ambulatory Visit: Payer: Self-pay | Admitting: Unknown Physician Specialty

## 2014-01-30 LAB — CBC WITH DIFFERENTIAL/PLATELET
Basophil #: 0 10*3/uL (ref 0.0–0.1)
Basophil %: 1 %
EOS PCT: 4.8 %
Eosinophil #: 0.2 10*3/uL (ref 0.0–0.7)
HCT: 30.3 % — ABNORMAL LOW (ref 40.0–52.0)
HGB: 9.9 g/dL — AB (ref 13.0–18.0)
Lymphocyte #: 1 10*3/uL (ref 1.0–3.6)
Lymphocyte %: 23.2 %
MCH: 27.6 pg (ref 26.0–34.0)
MCHC: 32.7 g/dL (ref 32.0–36.0)
MCV: 84 fL (ref 80–100)
MONOS PCT: 8.3 %
Monocyte #: 0.4 x10 3/mm (ref 0.2–1.0)
NEUTROS PCT: 62.7 %
Neutrophil #: 2.8 10*3/uL (ref 1.4–6.5)
PLATELETS: 59 10*3/uL — AB (ref 150–440)
RBC: 3.59 10*6/uL — ABNORMAL LOW (ref 4.40–5.90)
RDW: 17.4 % — ABNORMAL HIGH (ref 11.5–14.5)
WBC: 4.4 10*3/uL (ref 3.8–10.6)

## 2014-02-06 ENCOUNTER — Ambulatory Visit: Payer: Self-pay | Admitting: Unknown Physician Specialty

## 2014-02-06 LAB — CBC WITH DIFFERENTIAL/PLATELET
Basophil #: 0.1 10*3/uL (ref 0.0–0.1)
Basophil %: 1.4 %
Eosinophil #: 0.3 10*3/uL (ref 0.0–0.7)
Eosinophil %: 5.8 %
HCT: 32 % — ABNORMAL LOW (ref 40.0–52.0)
HGB: 10.8 g/dL — AB (ref 13.0–18.0)
LYMPHS PCT: 23.5 %
Lymphocyte #: 1.2 10*3/uL (ref 1.0–3.6)
MCH: 28.1 pg (ref 26.0–34.0)
MCHC: 33.6 g/dL (ref 32.0–36.0)
MCV: 84 fL (ref 80–100)
Monocyte #: 0.4 x10 3/mm (ref 0.2–1.0)
Monocyte %: 8.6 %
Neutrophil #: 3 10*3/uL (ref 1.4–6.5)
Neutrophil %: 60.7 %
PLATELETS: 73 10*3/uL — AB (ref 150–440)
RBC: 3.82 10*6/uL — AB (ref 4.40–5.90)
RDW: 17.3 % — ABNORMAL HIGH (ref 11.5–14.5)
WBC: 5 10*3/uL (ref 3.8–10.6)

## 2014-02-13 ENCOUNTER — Ambulatory Visit: Payer: Self-pay | Admitting: Unknown Physician Specialty

## 2014-02-13 LAB — CBC WITH DIFFERENTIAL/PLATELET
Basophil #: 0.1 10*3/uL (ref 0.0–0.1)
Basophil %: 1.3 %
EOS ABS: 0.1 10*3/uL (ref 0.0–0.7)
EOS PCT: 3.2 %
HCT: 32.1 % — ABNORMAL LOW (ref 40.0–52.0)
HGB: 10.6 g/dL — ABNORMAL LOW (ref 13.0–18.0)
LYMPHS ABS: 1 10*3/uL (ref 1.0–3.6)
Lymphocyte %: 23.4 %
MCH: 27.8 pg (ref 26.0–34.0)
MCHC: 33.1 g/dL (ref 32.0–36.0)
MCV: 84 fL (ref 80–100)
MONO ABS: 0.4 x10 3/mm (ref 0.2–1.0)
Monocyte %: 9.1 %
Neutrophil #: 2.6 10*3/uL (ref 1.4–6.5)
Neutrophil %: 63 %
Platelet: 69 10*3/uL — ABNORMAL LOW (ref 150–440)
RBC: 3.82 10*6/uL — AB (ref 4.40–5.90)
RDW: 16.5 % — ABNORMAL HIGH (ref 11.5–14.5)
WBC: 4.2 10*3/uL (ref 3.8–10.6)

## 2014-02-27 ENCOUNTER — Ambulatory Visit: Payer: Self-pay | Admitting: Unknown Physician Specialty

## 2014-02-27 LAB — CBC WITH DIFFERENTIAL/PLATELET
BASOS ABS: 0.1 10*3/uL (ref 0.0–0.1)
BASOS PCT: 1.3 %
EOS ABS: 0.2 10*3/uL (ref 0.0–0.7)
Eosinophil %: 5.6 %
HCT: 30.9 % — ABNORMAL LOW (ref 40.0–52.0)
HGB: 10.2 g/dL — ABNORMAL LOW (ref 13.0–18.0)
LYMPHS ABS: 0.9 10*3/uL — AB (ref 1.0–3.6)
LYMPHS PCT: 23.8 %
MCH: 27.3 pg (ref 26.0–34.0)
MCHC: 33 g/dL (ref 32.0–36.0)
MCV: 83 fL (ref 80–100)
Monocyte #: 0.3 x10 3/mm (ref 0.2–1.0)
Monocyte %: 8.4 %
NEUTROS PCT: 60.9 %
Neutrophil #: 2.4 10*3/uL (ref 1.4–6.5)
PLATELETS: 55 10*3/uL — AB (ref 150–440)
RBC: 3.72 10*6/uL — ABNORMAL LOW (ref 4.40–5.90)
RDW: 16.3 % — ABNORMAL HIGH (ref 11.5–14.5)
WBC: 3.9 10*3/uL (ref 3.8–10.6)

## 2014-03-06 ENCOUNTER — Ambulatory Visit: Payer: Self-pay | Admitting: Unknown Physician Specialty

## 2014-03-06 LAB — CBC WITH DIFFERENTIAL/PLATELET
BASOS PCT: 1.2 %
Basophil #: 0.1 10*3/uL (ref 0.0–0.1)
EOS ABS: 0.2 10*3/uL (ref 0.0–0.7)
Eosinophil %: 4.9 %
HCT: 30 % — ABNORMAL LOW (ref 40.0–52.0)
HGB: 9.8 g/dL — AB (ref 13.0–18.0)
LYMPHS PCT: 23.3 %
Lymphocyte #: 1.1 10*3/uL (ref 1.0–3.6)
MCH: 27 pg (ref 26.0–34.0)
MCHC: 32.8 g/dL (ref 32.0–36.0)
MCV: 82 fL (ref 80–100)
Monocyte #: 0.5 x10 3/mm (ref 0.2–1.0)
Monocyte %: 10 %
NEUTROS PCT: 60.6 %
Neutrophil #: 2.8 10*3/uL (ref 1.4–6.5)
Platelet: 61 10*3/uL — ABNORMAL LOW (ref 150–440)
RBC: 3.65 10*6/uL — ABNORMAL LOW (ref 4.40–5.90)
RDW: 16.6 % — ABNORMAL HIGH (ref 11.5–14.5)
WBC: 4.6 10*3/uL (ref 3.8–10.6)

## 2014-03-17 ENCOUNTER — Ambulatory Visit: Payer: Self-pay | Admitting: Unknown Physician Specialty

## 2014-03-17 LAB — CBC WITH DIFFERENTIAL/PLATELET
BASOS ABS: 0 10*3/uL (ref 0.0–0.1)
Basophil %: 1.3 %
EOS PCT: 4.7 %
Eosinophil #: 0.2 10*3/uL (ref 0.0–0.7)
HCT: 30.5 % — ABNORMAL LOW (ref 40.0–52.0)
HGB: 10.1 g/dL — ABNORMAL LOW (ref 13.0–18.0)
Lymphocyte #: 0.9 10*3/uL — ABNORMAL LOW (ref 1.0–3.6)
Lymphocyte %: 24.9 %
MCH: 26.4 pg (ref 26.0–34.0)
MCHC: 33 g/dL (ref 32.0–36.0)
MCV: 80 fL (ref 80–100)
MONO ABS: 0.2 x10 3/mm (ref 0.2–1.0)
Monocyte %: 6.9 %
Neutrophil #: 2.2 10*3/uL (ref 1.4–6.5)
Neutrophil %: 62.2 %
Platelet: 61 10*3/uL — ABNORMAL LOW (ref 150–440)
RBC: 3.81 10*6/uL — AB (ref 4.40–5.90)
RDW: 15.9 % — ABNORMAL HIGH (ref 11.5–14.5)
WBC: 3.5 10*3/uL — AB (ref 3.8–10.6)

## 2014-03-18 ENCOUNTER — Other Ambulatory Visit: Payer: Self-pay | Admitting: *Deleted

## 2014-03-27 ENCOUNTER — Ambulatory Visit: Payer: Self-pay | Admitting: Unknown Physician Specialty

## 2014-03-27 LAB — CBC WITH DIFFERENTIAL/PLATELET
Basophil #: 0.1 x10 3/mm 3 (ref 0.0–0.1)
Basophil %: 1.4 %
Eosinophil #: 0.3 x10 3/mm 3 (ref 0.0–0.7)
Eosinophil %: 5 %
HCT: 32.6 % — ABNORMAL LOW (ref 40.0–52.0)
HGB: 10.8 g/dL — ABNORMAL LOW (ref 13.0–18.0)
Lymphocyte %: 23.4 %
Lymphs Abs: 1.3 x10 3/mm 3 (ref 1.0–3.6)
MCH: 26.2 pg (ref 26.0–34.0)
MCHC: 33.1 g/dL (ref 32.0–36.0)
MCV: 79 fL — ABNORMAL LOW (ref 80–100)
Monocyte #: 0.4 x10 3/mm (ref 0.2–1.0)
Monocyte %: 8 %
Neutrophil #: 3.3 x10 3/mm 3 (ref 1.4–6.5)
Neutrophil %: 62.2 %
Platelet: 70 x10 3/mm 3 — ABNORMAL LOW (ref 150–440)
RBC: 4.12 x10 6/mm 3 — ABNORMAL LOW (ref 4.40–5.90)
RDW: 16.1 % — ABNORMAL HIGH (ref 11.5–14.5)
WBC: 5.4 x10 3/mm 3 (ref 3.8–10.6)

## 2014-04-10 ENCOUNTER — Ambulatory Visit: Payer: Self-pay | Admitting: Unknown Physician Specialty

## 2014-04-10 LAB — CBC WITH DIFFERENTIAL/PLATELET
Basophil #: 0.1 10*3/uL (ref 0.0–0.1)
Basophil %: 1.4 %
Eosinophil #: 0.2 10*3/uL (ref 0.0–0.7)
Eosinophil %: 5.2 %
HCT: 27.7 % — AB (ref 40.0–52.0)
HGB: 8.8 g/dL — AB (ref 13.0–18.0)
LYMPHS PCT: 24.3 %
Lymphocyte #: 1 10*3/uL (ref 1.0–3.6)
MCH: 24.9 pg — AB (ref 26.0–34.0)
MCHC: 31.9 g/dL — AB (ref 32.0–36.0)
MCV: 78 fL — AB (ref 80–100)
MONO ABS: 0.4 x10 3/mm (ref 0.2–1.0)
Monocyte %: 9.9 %
Neutrophil #: 2.5 10*3/uL (ref 1.4–6.5)
Neutrophil %: 59.2 %
PLATELETS: 71 10*3/uL — AB (ref 150–440)
RBC: 3.54 10*6/uL — ABNORMAL LOW (ref 4.40–5.90)
RDW: 16 % — ABNORMAL HIGH (ref 11.5–14.5)
WBC: 4.2 10*3/uL (ref 3.8–10.6)

## 2014-04-30 ENCOUNTER — Inpatient Hospital Stay: Payer: Self-pay | Admitting: Internal Medicine

## 2014-04-30 LAB — CBC WITH DIFFERENTIAL/PLATELET
Basophil #: 0.1 10*3/uL (ref 0.0–0.1)
Basophil %: 1.8 %
EOS PCT: 4.7 %
Eosinophil #: 0.1 10*3/uL (ref 0.0–0.7)
HCT: 18.9 % — AB (ref 40.0–52.0)
HGB: 5.7 g/dL — ABNORMAL LOW (ref 13.0–18.0)
Lymphocyte #: 0.8 10*3/uL — ABNORMAL LOW (ref 1.0–3.6)
Lymphocyte %: 27.6 %
MCH: 22.5 pg — ABNORMAL LOW (ref 26.0–34.0)
MCHC: 30.4 g/dL — AB (ref 32.0–36.0)
MCV: 74 fL — ABNORMAL LOW (ref 80–100)
MONO ABS: 0.2 x10 3/mm (ref 0.2–1.0)
MONOS PCT: 8 %
NEUTROS ABS: 1.7 10*3/uL (ref 1.4–6.5)
Neutrophil %: 57.9 %
Platelet: 56 10*3/uL — ABNORMAL LOW (ref 150–440)
RBC: 2.55 10*6/uL — ABNORMAL LOW (ref 4.40–5.90)
RDW: 16.7 % — ABNORMAL HIGH (ref 11.5–14.5)
WBC: 3 10*3/uL — ABNORMAL LOW (ref 3.8–10.6)

## 2014-04-30 LAB — BASIC METABOLIC PANEL
ANION GAP: 5 — AB (ref 7–16)
BUN: 27 mg/dL — AB (ref 7–18)
Calcium, Total: 7.6 mg/dL — ABNORMAL LOW (ref 8.5–10.1)
Chloride: 111 mmol/L — ABNORMAL HIGH (ref 98–107)
Co2: 27 mmol/L (ref 21–32)
Creatinine: 1 mg/dL (ref 0.60–1.30)
EGFR (African American): >60
EGFR (Non-African Amer.): >60
Glucose: 274 mg/dL — ABNORMAL HIGH (ref 65–99)
Osmolality: 300 (ref 275–301)
POTASSIUM: 4.2 mmol/L (ref 3.5–5.1)
Sodium: 143 mmol/L (ref 136–145)

## 2014-05-01 HISTORY — PX: UPPER GI ENDOSCOPY: SHX6162

## 2014-05-01 LAB — CBC WITH DIFFERENTIAL/PLATELET
Basophil #: 0.1 10*3/uL (ref 0.0–0.1)
Basophil %: 1.7 %
EOS PCT: 4.6 %
Eosinophil #: 0.3 10*3/uL (ref 0.0–0.7)
HCT: 26.7 % — AB (ref 40.0–52.0)
HGB: 8.5 g/dL — ABNORMAL LOW (ref 13.0–18.0)
LYMPHS ABS: 1 10*3/uL (ref 1.0–3.6)
LYMPHS PCT: 18.4 %
MCH: 23.7 pg — ABNORMAL LOW (ref 26.0–34.0)
MCHC: 31.8 g/dL — ABNORMAL LOW (ref 32.0–36.0)
MCV: 74 fL — AB (ref 80–100)
MONOS PCT: 5.3 %
Monocyte #: 0.3 x10 3/mm (ref 0.2–1.0)
NEUTROS ABS: 4 10*3/uL (ref 1.4–6.5)
Neutrophil %: 70 %
Platelet: 79 10*3/uL — ABNORMAL LOW (ref 150–440)
RBC: 3.59 10*6/uL — AB (ref 4.40–5.90)
RDW: 16.7 % — ABNORMAL HIGH (ref 11.5–14.5)
WBC: 5.6 10*3/uL (ref 3.8–10.6)

## 2014-05-01 LAB — PROTIME-INR
INR: 1.3
Prothrombin Time: 16.1 secs — ABNORMAL HIGH (ref 11.5–14.7)

## 2014-05-01 LAB — HEPATIC FUNCTION PANEL A (ARMC)
ALK PHOS: 84 U/L
Albumin: 1.9 g/dL — ABNORMAL LOW (ref 3.4–5.0)
Bilirubin, Direct: 0.3 mg/dL — ABNORMAL HIGH (ref 0.0–0.2)
Bilirubin,Total: 2.1 mg/dL — ABNORMAL HIGH (ref 0.2–1.0)
SGOT(AST): 33 U/L (ref 15–37)
SGPT (ALT): 10 U/L — ABNORMAL LOW
TOTAL PROTEIN: 5.6 g/dL — AB (ref 6.4–8.2)

## 2014-05-01 LAB — BASIC METABOLIC PANEL
Anion Gap: 7 (ref 7–16)
BUN: 26 mg/dL — AB (ref 7–18)
CO2: 23 mmol/L (ref 21–32)
CREATININE: 1.08 mg/dL (ref 0.60–1.30)
Calcium, Total: 7.9 mg/dL — ABNORMAL LOW (ref 8.5–10.1)
Chloride: 111 mmol/L — ABNORMAL HIGH (ref 98–107)
Glucose: 80 mg/dL (ref 65–99)
OSMOLALITY: 285 (ref 275–301)
POTASSIUM: 4.2 mmol/L (ref 3.5–5.1)
Sodium: 141 mmol/L (ref 136–145)

## 2014-05-01 LAB — HEMOGLOBIN A1C: HEMOGLOBIN A1C: 7.4 % — AB (ref 4.2–6.3)

## 2014-05-01 LAB — OCCULT BLOOD X 1 CARD TO LAB, STOOL: OCCULT BLOOD, FECES: POSITIVE

## 2014-05-01 LAB — MAGNESIUM: Magnesium: 1.8 mg/dL

## 2014-05-02 LAB — CBC WITH DIFFERENTIAL/PLATELET
BASOS ABS: 0.1 10*3/uL (ref 0.0–0.1)
Basophil %: 1.1 %
EOS ABS: 0.2 10*3/uL (ref 0.0–0.7)
Eosinophil %: 4 %
HCT: 24.4 % — ABNORMAL LOW (ref 40.0–52.0)
HGB: 7.9 g/dL — AB (ref 13.0–18.0)
Lymphocyte #: 1.5 10*3/uL (ref 1.0–3.6)
Lymphocyte %: 25.5 %
MCH: 23.6 pg — ABNORMAL LOW (ref 26.0–34.0)
MCHC: 32.2 g/dL (ref 32.0–36.0)
MCV: 73 fL — ABNORMAL LOW (ref 80–100)
MONO ABS: 0.5 x10 3/mm (ref 0.2–1.0)
MONOS PCT: 8.8 %
Neutrophil #: 3.6 10*3/uL (ref 1.4–6.5)
Neutrophil %: 60.6 %
PLATELETS: 66 10*3/uL — AB (ref 150–440)
RBC: 3.33 10*6/uL — AB (ref 4.40–5.90)
RDW: 17.2 % — AB (ref 11.5–14.5)
WBC: 5.9 10*3/uL (ref 3.8–10.6)

## 2014-05-02 LAB — BASIC METABOLIC PANEL
Anion Gap: 7 (ref 7–16)
BUN: 30 mg/dL — ABNORMAL HIGH (ref 7–18)
Calcium, Total: 7 mg/dL — CL (ref 8.5–10.1)
Chloride: 113 mmol/L — ABNORMAL HIGH (ref 98–107)
Co2: 26 mmol/L (ref 21–32)
Creatinine: 1.36 mg/dL — ABNORMAL HIGH (ref 0.60–1.30)
EGFR (African American): >60
EGFR (Non-African Amer.): 57 — ABNORMAL LOW
Glucose: 93 mg/dL (ref 65–99)
Osmolality: 296 (ref 275–301)
Potassium: 4.3 mmol/L (ref 3.5–5.1)
SODIUM: 146 mmol/L — AB (ref 136–145)

## 2014-05-03 LAB — AMMONIA: AMMONIA, PLASMA: 160 umol/L — AB (ref 11–32)

## 2014-05-03 LAB — BASIC METABOLIC PANEL
Anion Gap: 7 (ref 7–16)
BUN: 27 mg/dL — ABNORMAL HIGH (ref 7–18)
CHLORIDE: 115 mmol/L — AB (ref 98–107)
CREATININE: 1.01 mg/dL (ref 0.60–1.30)
Calcium, Total: 7 mg/dL — CL (ref 8.5–10.1)
Co2: 24 mmol/L (ref 21–32)
GLUCOSE: 121 mg/dL — AB (ref 65–99)
Osmolality: 297 (ref 275–301)
POTASSIUM: 4.2 mmol/L (ref 3.5–5.1)
SODIUM: 146 mmol/L — AB (ref 136–145)

## 2014-05-03 LAB — CBC WITH DIFFERENTIAL/PLATELET
BASOS PCT: 0.7 %
Basophil #: 0 10*3/uL (ref 0.0–0.1)
EOS ABS: 0.2 10*3/uL (ref 0.0–0.7)
EOS PCT: 5.3 %
HCT: 24.6 % — ABNORMAL LOW (ref 40.0–52.0)
HGB: 7.8 g/dL — ABNORMAL LOW (ref 13.0–18.0)
LYMPHS ABS: 1.2 10*3/uL (ref 1.0–3.6)
Lymphocyte %: 29 %
MCH: 23.5 pg — ABNORMAL LOW (ref 26.0–34.0)
MCHC: 31.6 g/dL — ABNORMAL LOW (ref 32.0–36.0)
MCV: 74 fL — AB (ref 80–100)
Monocyte #: 0.4 x10 3/mm (ref 0.2–1.0)
Monocyte %: 10.1 %
NEUTROS ABS: 2.4 10*3/uL (ref 1.4–6.5)
NEUTROS PCT: 54.9 %
Platelet: 55 10*3/uL — ABNORMAL LOW (ref 150–440)
RBC: 3.3 10*6/uL — AB (ref 4.40–5.90)
RDW: 17.4 % — ABNORMAL HIGH (ref 11.5–14.5)
WBC: 4.3 10*3/uL (ref 3.8–10.6)

## 2014-05-04 LAB — AMMONIA: Ammonia, Plasma: 76 mcmol/L — ABNORMAL HIGH (ref 11–32)

## 2014-05-18 ENCOUNTER — Ambulatory Visit: Payer: Self-pay | Admitting: Unknown Physician Specialty

## 2014-05-29 ENCOUNTER — Ambulatory Visit: Payer: Self-pay | Admitting: Unknown Physician Specialty

## 2014-05-29 ENCOUNTER — Ambulatory Visit: Payer: Self-pay | Admitting: Internal Medicine

## 2014-05-29 LAB — CBC WITH DIFFERENTIAL/PLATELET
Basophil #: 0.1 10*3/uL (ref 0.0–0.1)
Basophil %: 1.9 %
EOS ABS: 0.2 10*3/uL (ref 0.0–0.7)
EOS PCT: 5.7 %
HCT: 21.9 % — ABNORMAL LOW (ref 40.0–52.0)
HGB: 6.4 g/dL — ABNORMAL LOW (ref 13.0–18.0)
Lymphocyte #: 0.9 10*3/uL — ABNORMAL LOW (ref 1.0–3.6)
Lymphocyte %: 23.6 %
MCH: 19.9 pg — ABNORMAL LOW (ref 26.0–34.0)
MCHC: 29.2 g/dL — AB (ref 32.0–36.0)
MCV: 68 fL — ABNORMAL LOW (ref 80–100)
MONO ABS: 0.4 x10 3/mm (ref 0.2–1.0)
MONOS PCT: 9.5 %
NEUTROS ABS: 2.3 10*3/uL (ref 1.4–6.5)
Neutrophil %: 59.3 %
PLATELETS: 64 10*3/uL — AB (ref 150–440)
RBC: 3.2 10*6/uL — AB (ref 4.40–5.90)
RDW: 18.3 % — ABNORMAL HIGH (ref 11.5–14.5)
WBC: 3.9 10*3/uL (ref 3.8–10.6)

## 2014-06-02 ENCOUNTER — Ambulatory Visit: Payer: Self-pay | Admitting: Internal Medicine

## 2014-06-02 LAB — FERRITIN: Ferritin (ARMC): 8 ng/mL (ref 8–388)

## 2014-06-02 LAB — CANCER CENTER HEMOGLOBIN: HGB: 7.8 g/dL — ABNORMAL LOW (ref 13.0–18.0)

## 2014-06-17 ENCOUNTER — Ambulatory Visit: Payer: Self-pay | Admitting: Unknown Physician Specialty

## 2014-06-17 LAB — CBC WITH DIFFERENTIAL/PLATELET
BASOS ABS: 0.1 10*3/uL (ref 0.0–0.1)
BASOS PCT: 1.5 %
EOS ABS: 0.2 10*3/uL (ref 0.0–0.7)
EOS PCT: 5.1 %
HCT: 27.7 % — AB (ref 40.0–52.0)
HGB: 8.5 g/dL — AB (ref 13.0–18.0)
Lymphocyte #: 0.9 10*3/uL — ABNORMAL LOW (ref 1.0–3.6)
Lymphocyte %: 21.7 %
MCH: 22.2 pg — ABNORMAL LOW (ref 26.0–34.0)
MCHC: 30.6 g/dL — AB (ref 32.0–36.0)
MCV: 73 fL — ABNORMAL LOW (ref 80–100)
MONO ABS: 0.3 x10 3/mm (ref 0.2–1.0)
Monocyte %: 8.1 %
NEUTROS PCT: 63.6 %
Neutrophil #: 2.7 10*3/uL (ref 1.4–6.5)
Platelet: 87 10*3/uL — ABNORMAL LOW (ref 150–440)
RBC: 3.81 10*6/uL — ABNORMAL LOW (ref 4.40–5.90)
RDW: 24.2 % — AB (ref 11.5–14.5)
WBC: 4.2 10*3/uL (ref 3.8–10.6)

## 2014-06-19 ENCOUNTER — Ambulatory Visit: Payer: Self-pay | Admitting: Internal Medicine

## 2014-06-26 ENCOUNTER — Ambulatory Visit: Payer: Self-pay | Admitting: Unknown Physician Specialty

## 2014-07-17 ENCOUNTER — Ambulatory Visit: Payer: Self-pay | Admitting: Unknown Physician Specialty

## 2014-07-17 LAB — CBC WITH DIFFERENTIAL/PLATELET
Basophil #: 0.1 10*3/uL (ref 0.0–0.1)
Basophil %: 1.6 %
EOS ABS: 0.2 10*3/uL (ref 0.0–0.7)
Eosinophil %: 6.3 %
HCT: 26.6 % — AB (ref 40.0–52.0)
HGB: 8 g/dL — AB (ref 13.0–18.0)
LYMPHS PCT: 23.7 %
Lymphocyte #: 0.9 10*3/uL — ABNORMAL LOW (ref 1.0–3.6)
MCH: 19.3 pg — AB (ref 26.0–34.0)
MCHC: 29.9 g/dL — ABNORMAL LOW (ref 32.0–36.0)
MCV: 65 fL — ABNORMAL LOW (ref 80–100)
MONO ABS: 0.3 x10 3/mm (ref 0.2–1.0)
Monocyte %: 8.2 %
NEUTROS ABS: 2.4 10*3/uL (ref 1.4–6.5)
Neutrophil %: 60.2 %
Platelet: 75 10*3/uL — ABNORMAL LOW (ref 150–440)
RBC: 4.12 10*6/uL — AB (ref 4.40–5.90)
RDW: 18.9 % — ABNORMAL HIGH (ref 11.5–14.5)
WBC: 3.9 10*3/uL (ref 3.8–10.6)

## 2014-07-22 ENCOUNTER — Ambulatory Visit: Payer: Self-pay | Admitting: Internal Medicine

## 2014-07-23 ENCOUNTER — Encounter: Payer: Self-pay | Admitting: General Surgery

## 2014-07-23 ENCOUNTER — Ambulatory Visit (INDEPENDENT_AMBULATORY_CARE_PROVIDER_SITE_OTHER): Admitting: General Surgery

## 2014-07-23 DIAGNOSIS — K7031 Alcoholic cirrhosis of liver with ascites: Secondary | ICD-10-CM | POA: Diagnosis not present

## 2014-07-23 NOTE — Patient Instructions (Addendum)
Ascites Drainage Catheter Placement Ascites is a gathering of fluid in the abdomen. Excess fluid can collect in the body from underlying conditions such as cancer or heart failure. Inserting an ascites drainage catheter is done to help drain fluid out of the body. A catheter is a thin, flexible tube. The catheter can stay attached to your body for several months. Draining the fluid will help you to avoid pain and other symptoms. In most cases, you will be able to go home the same day as the procedure (outpatient).  LET YOUR CAREGIVER KNOW ABOUT:  Allergies to food or medicine.  Medicines taken, including vitamins, herbs, eyedrops, over-the-counter medicines, and creams.  Use of steroids (by mouth or creams).  Previous problems with anesthetics or numbing medicines.  History of bleeding problems or blood clots.  Previous abdominal or pelvic surgery.  Other health problems, including diabetes and kidney problems.  Possibility of pregnancy, if this applies. RISKS AND COMPLICATIONS General surgical complications may include:  Reaction to anesthesia.  Damage to surrounding nerves, tissues, or structures.  A small hole (perforation) in your bowel.  Infection.  Bleeding. The following complications are very rare:  Electrolyte imbalance.  Leakage of fluids.  Blockage in the bowel.  Kidney failure.  Liver problems. BEFORE THE PROCEDURE It is important to follow your caregiver's instructions prior to your procedure to avoid complications. Steps before your procedure may include:  A physical exam, blood tests, urine tests, X-rays, ultrasound exams, and other procedures.  Chemotherapy or radiation therapy.  A review of the procedure, the anesthesia being used, and what to expect after the procedure. You may be asked to:  Stop taking certain medicines for several days prior to your procedure, such as blood thinners (including aspirin).  Take certain medicines, such as  antibiotics.  Quit smoking. Smoking increases the chances of a healing problem after your procedure. Arrange for someone to drive you home after surgery. You will also need someone to help you with drainage at home if you cannot do it yourself. Your caregiver can help you arrange for a home health service, if needed. PROCEDURE You will lie down on an X-ray table with your head elevated. Medicine will be given to numb the abdominal area (local anesthetic). You will be given medicine to help you relax (sedative). Your blood pressure and heart rate will be monitored during the procedure. Your caregiver will clean the skin around the insertion site with antibacterial solution. A towel will be draped over the abdominal area to provide a clean work area. Ultrasound will be used to guide the placement of the catheter. An X-ray may be taken to ensure proper placement. Stitches will be placed on the skin around the catheter. The procedure takes about 15 minutes. AFTER THE PROCEDURE  Your caregiver may drain the fluid.  You will need to stay and rest in the recovery area for a few hours until the sedative wears off.  You can eat and drink as normal.  You may need to return to your caregiver in 4 to 5 days to have your stitches removed. Document Released: 05/25/2011 Document Revised: 08/28/2011 Document Reviewed: 05/25/2011 [facility] Patient Information 2015 [place], [place]. This information is not intended to replace advice given to you by your health care provider. Make sure you discuss any questions you have with your health care provider.  Ascites Drainage Catheter Home Guide There are several types of catheter systems that may be used for ascites drainage. Follow these general guidelines as well as  specific instructions from your caregiver. Your caregiver will teach you how to use your system, as well as provide specific drainage kit instructions from the manufacturer.  DRAINING FROM THE  CATHETER  Clean all surfaces.  Wash your hands thoroughly with warm water and soap.  Wear a mask to cover your nose and mouth.  Make sure all the supplies are within reach on a clean, germ-free (sterile) surface.  Attach the adapter or access tip to the safety valve on your catheter. You may need to remove a protective cap on the safety valve.  Release the clamps on your drainage container.  Allow fluid to drain into the container.  Close the clamps and release the catheter from the drainage container.  You may be asked to record the amount of fluid or bring it to your caregiver's office.  Your caregiver will let you know when and how much fluid to drain. AFTER DRAINING  Wipe the end of your catheter as directed with an antiseptic solution, such as rubbing alcohol.  Put the protective cap back on the safety valve.  Place gauze bandages (dressings) around the catheter site as directed.  Secure the catheter to your body with gauze and dressings as directed. OTHER TIPS  Ask your caregiver about periodic flushing of the catheter.  Ask your caregiver about specific dietary instructions.  Follow up with your caregiver as directed. SEEK MEDICAL CARE IF:  You have trouble draining or caring for your catheter.  You have chills.  You notice bleeding, skin irritation, drainage, redness, or pain around the insertion site.  You have abdominal pain, bloating, pressure, or cramping.  The fluid draining out of the catheter is cloudy or has a bad smell.  You have other new symptoms.  You have questions or concerns. SEEK IMMEDIATE MEDICAL CARE IF:  Your abdominal pain does not go away or becomes severe.  You have a fever. MAKE SURE YOU:  Understand these instructions.  Will watch your condition.  Will get help right away if you are not doing well or get worse. Document Released: 05/25/2011 Document Revised: 08/28/2011 Document Reviewed: 05/25/2011 [facility] Patient  Information 2015 [place], [place]. This information is not intended to replace advice given to you by your health care provider. Make sure you discuss any questions you have with your health care provider.

## 2014-07-23 NOTE — Progress Notes (Signed)
Patient ID: [name], male   DOB: 07/30/1955, 59 y.o.   MRN: [id number]  Chief Complaint  Patient presents with  . Other    peritoneal catheter    HPI [name] is a 59 y.o. male.  Here today for evaluation of peritoneal drainage catheter for home use due to his ascites. Currently under the care of Hospice. He currently has his abdomen drained every week.  HPI  Past Medical History  Diagnosis Date  . Arthritis   . Depression   . GERD (gastroesophageal reflux disease)   . HTN (hypertension)   . HLD (hyperlipidemia)   . History of colon polyps   . Urine incontinence   . Alcohol abuse, in remission     no drink in 15 years  . Alcoholic cirrhosis     stage 4 Markham Jordan) s/p TIPS at [facility], not transplant candidate, h/o hepatic encephalopathy  . Iron deficiency anemia     IV venofer per Dr. Neale Burly at [facility] center, goal ferritin >50  . Diabetes type 2, uncontrolled 2001    prior saw endo Dr. Hyacinth Meeker (c-peptide elevated)  . Esophageal varices     history  . Diabetic neuropathy   . Pancytopenia     due to splenomegaly from cirrhosis (sees Dr. Neale Burly at [facility] heme), target Hgb 8 for transfusions  . OSA (obstructive sleep apnea)   . GI bleed     history  . Splenomegaly 2013  . Hydrothorax 2011    s/p pleurocentesis   . Cirrhosis   . Sleep disorder   . Prostate enlargement   . Hiatal hernia     Past Surgical History  Procedure Laterality Date  . Tips procedure  2012    Dr Burtis Junes  . Esophageal dilation    . Esophageal varice ligation    . Upper gi endoscopy  05-01-14    Dr Mechele Collin    Family History  Problem Relation Age of Onset  . Diabetes Brother   . Diabetes Father   . Cancer Father     colon  . Cancer Mother     colon  . CAD Neg Hx   . Stroke Paternal Uncle     Social History History  Substance Use Topics  . Smoking status: Never Smoker   . Smokeless tobacco: Never Used  . Alcohol Use: No     Comment: in remission x 15 years    Allergies   Allergen Reactions  . Iron Nausea And Vomiting    GI upset to oral iron only    Current Outpatient Prescriptions  Medication Sig Dispense Refill  . FLUoxetine (PROZAC) 20 MG tablet Take 20 mg by mouth daily.    . furosemide (LASIX) 40 MG tablet Take 1 tablet (40 mg total) by mouth daily. 90 tablet 3  . glucose blood (FREESTYLE LITE) test strip 1 each by Other route 3 (three) times daily. Use as instructed    . insulin glargine (LANTUS) 100 UNIT/ML injection Inject 30 Units into the skin 2 (two) times daily.    . insulin regular (NOVOLIN R) 100 units/mL injection Inject into the skin 3 (three) times daily before meals. 23 Units at breakfast; 23 Units at lunch; 26 Units at supper 20 mL 6  . lactulose, encephalopathy, (CHRONULAC) 10 GM/15ML SOLN Take by mouth.    . nadolol (CORGARD) 40 MG tablet Take 40 mg by mouth daily.    Marland Kitchen omeprazole (PRILOSEC) 40 MG capsule Take 1 capsule (40 mg total)  by mouth 2 (two) times daily. *Needs to be seen for more refills* 60 capsule 0  . rifaximin (XIFAXAN) 550 MG TABS Take 550 mg by mouth 2 (two) times daily.    Marland Kitchen. spironolactone (ALDACTONE) 100 MG tablet Take 0.5 tablets (50 mg total) by mouth daily. 45 tablet 3   No current facility-administered medications for this visit.    Review of Systems Review of Systems  Constitutional: Positive for fatigue.  Respiratory: Negative.   Cardiovascular: Negative.   Gastrointestinal: Positive for abdominal distention.    Blood pressure 132/74, pulse 72, resp. rate 18, height 5\' 4"  (1.626 m), weight 224 lb (101.606 kg).  Physical Exam Physical Exam  Constitutional: He is oriented to person, place, and time. He is cooperative. He has a sickly appearance.  Pale complexion  Eyes: Conjunctivae are normal.  Neck: Neck supple.  Cardiovascular: Normal rate, regular rhythm and normal heart sounds.   Pulmonary/Chest: Effort normal and breath sounds normal.  Abdominal: Soft. Bowel sounds are normal. He exhibits  distension and fluid wave. There is no tenderness.  Markedly distended, abdomen with dullness to percussion  Neurological: He is alert and oriented to person, place, and time.  Skin: Skin is warm and dry. No pallor.    Data Reviewed Office notes, recent endoscopy and labs.  Assessment    Ascites, problematic. Liver failure, post TIPS.  Pt now requiring weekly paracentesis. He is on hospice care and feel placing a pleurx peritoneal catheter is reasonable.     Plan    Procedure and risks explained to pt and he is agreeable to a peritoneal catheter.        Rashad Obeid G 07/23/2014, 2:55 PM

## 2014-07-24 ENCOUNTER — Ambulatory Visit: Payer: Self-pay | Admitting: Unknown Physician Specialty

## 2014-07-24 ENCOUNTER — Telehealth: Payer: Self-pay

## 2014-07-24 ENCOUNTER — Ambulatory Visit: Payer: Self-pay | Admitting: General Surgery

## 2014-07-24 DIAGNOSIS — K7031 Alcoholic cirrhosis of liver with ascites: Secondary | ICD-10-CM | POA: Diagnosis not present

## 2014-07-24 LAB — CBC WITH DIFFERENTIAL/PLATELET
Basophils Absolute: 0.1 10*3/uL (ref 0.0–0.2)
Basos: 1 %
EOS ABS: 0.2 10*3/uL (ref 0.0–0.4)
Eos: 5 %
HEMATOCRIT: 28.8 % — AB (ref 37.5–51.0)
Hemoglobin: 8.4 g/dL — ABNORMAL LOW (ref 12.6–17.7)
IMMATURE GRANS (ABS): 0 10*3/uL (ref 0.0–0.1)
Immature Granulocytes: 0 %
Lymphocytes Absolute: 1.3 10*3/uL (ref 0.7–3.1)
Lymphs: 25 %
MCH: 19.2 pg — ABNORMAL LOW (ref 26.6–33.0)
MCHC: 29.2 g/dL — AB (ref 31.5–35.7)
MCV: 66 fL — ABNORMAL LOW (ref 79–97)
MONOS ABS: 0.4 10*3/uL (ref 0.1–0.9)
Monocytes: 8 %
NEUTROS ABS: 3 10*3/uL (ref 1.4–7.0)
Neutrophils Relative %: 61 %
Platelets: 92 10*3/uL — CL (ref 150–379)
RBC: 4.38 x10E6/uL (ref 4.14–5.80)
RDW: 17.3 % — AB (ref 12.3–15.4)
WBC: 5 10*3/uL (ref 3.4–10.8)

## 2014-07-24 LAB — BASIC METABOLIC PANEL
BUN / CREAT RATIO: 24 — AB (ref 9–20)
BUN: 20 mg/dL (ref 6–24)
CALCIUM: 7.9 mg/dL — AB (ref 8.7–10.2)
CO2: 22 mmol/L (ref 18–29)
Chloride: 107 mmol/L (ref 97–108)
Creatinine, Ser: 0.84 mg/dL (ref 0.76–1.27)
GFR calc Af Amer: 111 mL/min/{1.73_m2} (ref >59)
GFR, EST NON AFRICAN AMERICAN: 96 mL/min/{1.73_m2} (ref >59)
GLUCOSE: 193 mg/dL — AB (ref 65–99)
POTASSIUM: 4.6 mmol/L (ref 3.5–5.2)
Sodium: 139 mmol/L (ref 134–144)

## 2014-07-24 LAB — PROTIME-INR
INR: 1.1 (ref 0.8–1.2)
Prothrombin Time: 12 s (ref 9.1–12.0)

## 2014-07-24 NOTE — Telephone Encounter (Signed)
Spoke with Inetta Fermoina at [facility] who is the patient's primary care nurse regarding his surgery today. I let her know that the patient would be having his catheter placement done today and that the surgeon would be draining him as much as possible. She said that she would need the orders sent to her regarding how often to drain the patient and also how much to drain off. She also needs his discharge instructions faxed to her so she may set up his home care. I spoke with Dewayne HatchAnn in Same Day Surgery and relayed this information to her.

## 2014-07-27 ENCOUNTER — Encounter: Payer: Self-pay | Admitting: General Surgery

## 2014-08-21 ENCOUNTER — Ambulatory Visit: Payer: Self-pay | Admitting: Unknown Physician Specialty

## 2014-08-25 ENCOUNTER — Ambulatory Visit
Admit: 2014-08-25 | Disposition: A | Discharge status: Home / Self Care | Payer: Self-pay | Attending: Internal Medicine | Admitting: Internal Medicine

## 2014-09-02 ENCOUNTER — Telehealth: Payer: Self-pay | Admitting: *Deleted

## 2014-09-02 NOTE — Telephone Encounter (Signed)
He called asking about his blood pressure being too low. He states that his blood pressure has been low and they(hospice) is unable to draw fluid off his abdomen. He states he is still taking b/p meds. Advised to talk with the hospice nurse and/or his primary doctor to see about adjusting his B/P meds.

## 2014-09-18 ENCOUNTER — Ambulatory Visit
Admit: 2014-09-18 | Disposition: A | Discharge status: Home / Self Care | Payer: Self-pay | Attending: Internal Medicine | Admitting: Internal Medicine

## 2014-10-03 IMAGING — US US GUIDE NEEDLE - US PARA
1 series · 11 of 11 positions shown · non-contrast
Comparison: none

REASON FOR EXAM: ascite
COMMENTS:

PROCEDURE:     US  - US GUIDED PARACENTESIS  - March 18, 2013  [DATE]
RESULT:     Ultrasound Guided Paracentesis
INDICATION: Ascites.  Paracentesis is requested.
Comparisons: None

[Series 1: us guide needle - us para · 0.31mm/px · 11 of 11 slices shown]
[im 1/11]
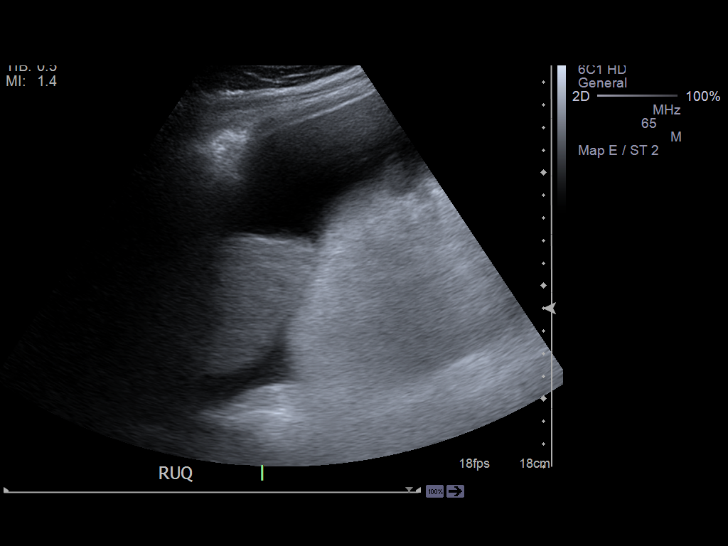
[im 2/11]
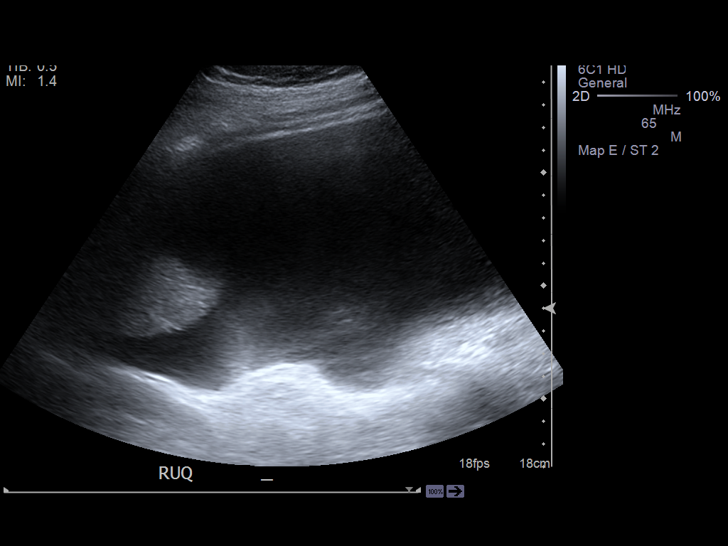
[im 3/11]
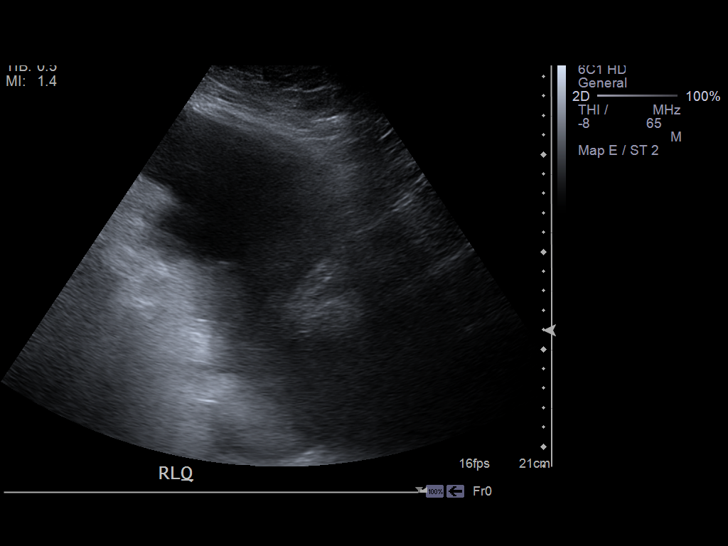
[im 4/11]
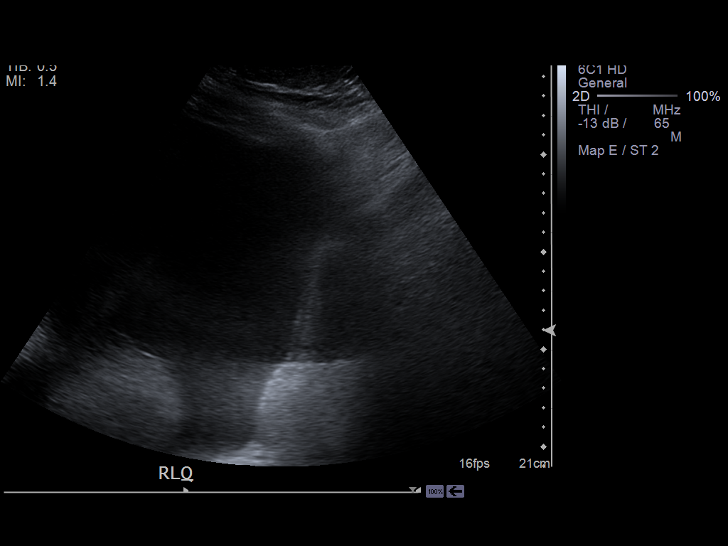
[im 5/11]
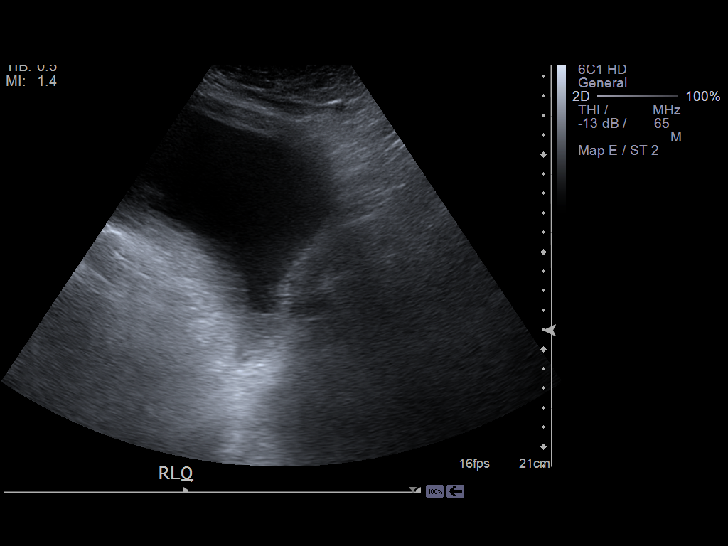
[im 6/11]
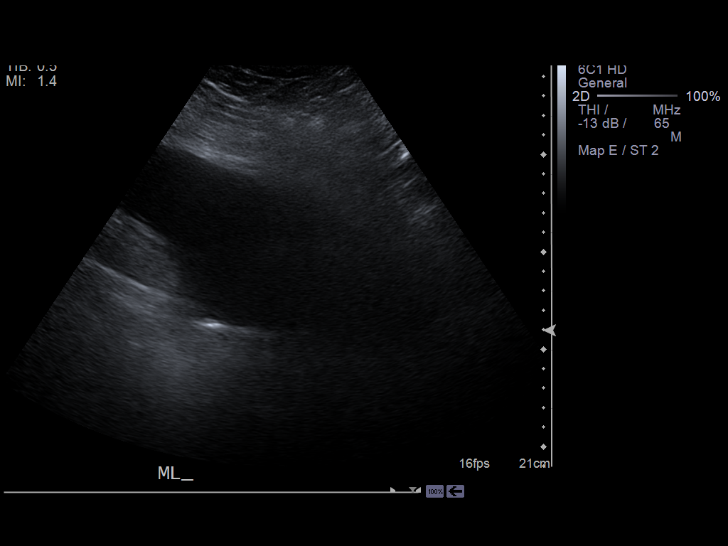
[im 7/11]
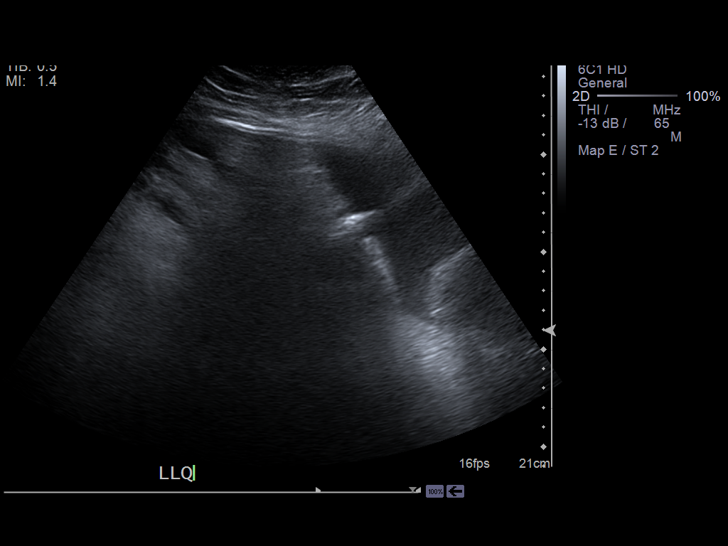
[im 8/11]
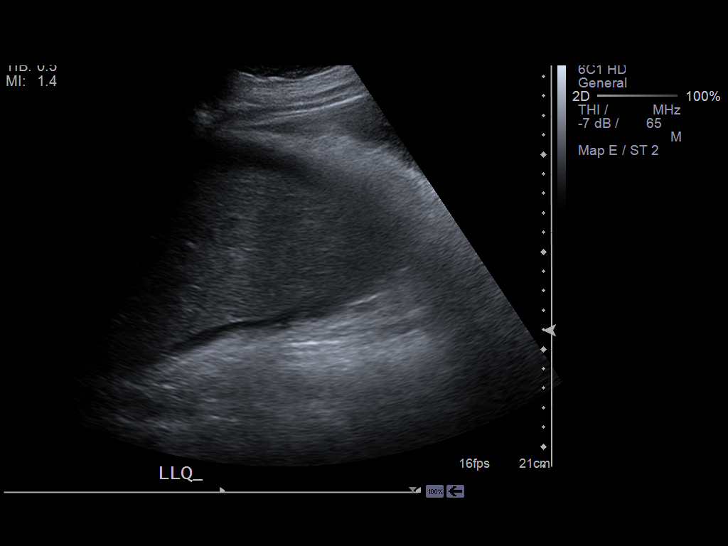
[im 9/11]
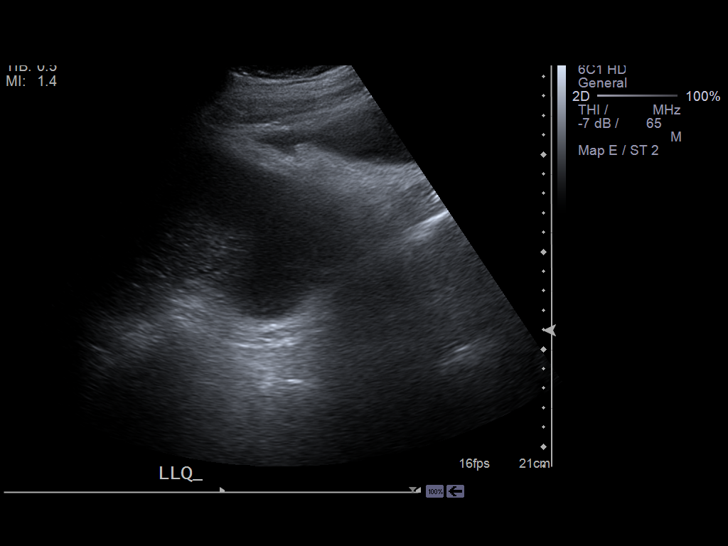
[im 10/11]
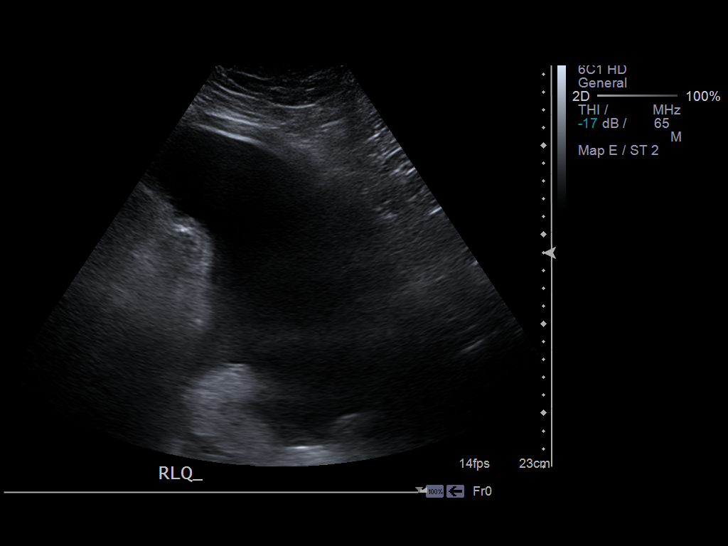
[im 11/11]
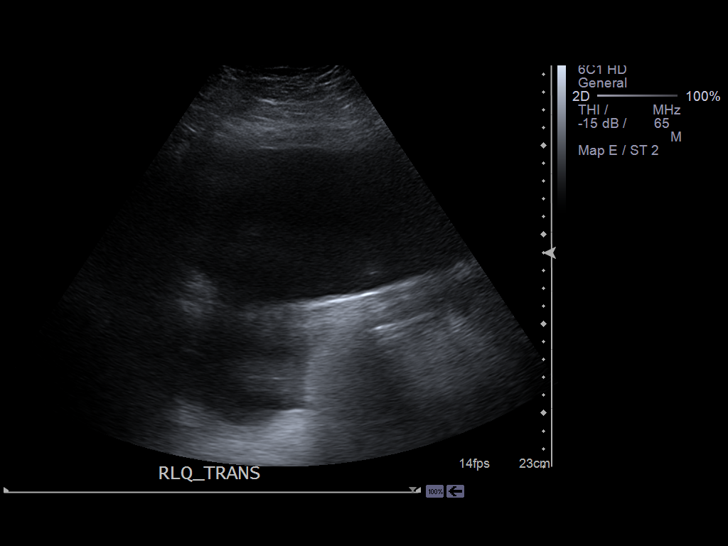

[11 of 11 positions shown; findings below may reference images not displayed]

FINDINGS: Real-time sonography of the abdomen is performed. There is a
moderate amount of abdominal ascites.
IMPRESSION: Moderate ascites.

[REDACTED]

## 2014-10-03 IMAGING — CR DG CHEST 1V PORT
1 series · 1 of 1 positions shown · non-contrast
Comparison: none

REASON FOR EXAM: Chest Pain
COMMENTS:

[ap]
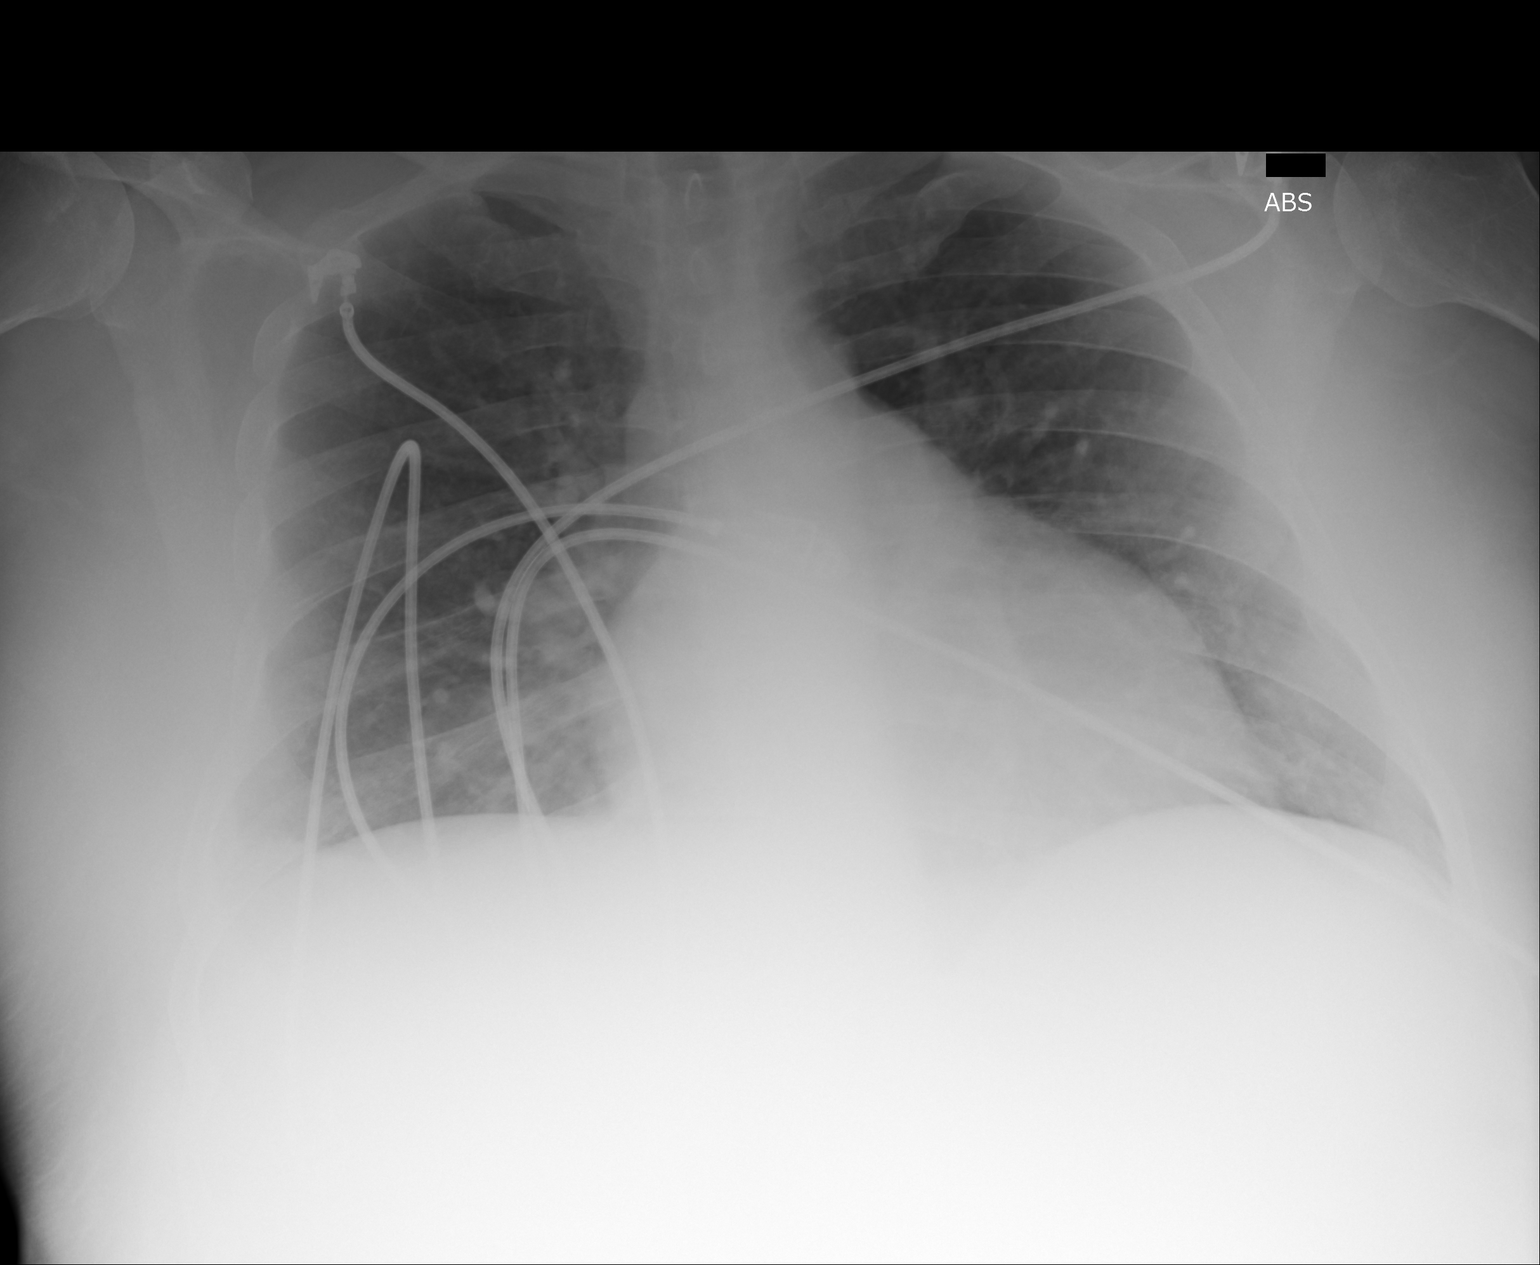

[1 of 1 positions shown; findings below may reference images not displayed]

PROCEDURE:     DXR - DXR PORTABLE CHEST SINGLE VIEW  - March 17, 2013 [DATE]

RESULT:     Comparison is made to study August 09, 2011.

The lungs are reasonably well inflated. There is hazy density in the right
lateral costophrenic gutter. The cardiac silhouette is mildly enlarged and
stable. The pulmonary vascularity is prominent centrally but this is not new.
IMPRESSION: 1. There may be low-grade CHF. This is not a new finding.
2. New density in the right lateral costophrenic angle is suspected. A
followup PA and lateral chest x-ray with deep inspiration would be of value.

[REDACTED]

## 2014-10-09 NOTE — Consult Note (Signed)
PATIENT NAME:  [name], [name] MR#:  [id number] DATE OF BIRTH:  04/23/1956  DATE OF CONSULTATION:  03/17/2013  REFERRING PHYSICIAN:  Shaune PollackQing Chen, MD CONSULTING PHYSICIAN:  Joselyn ArrowKandice L. Klover Priestly, NP PRIMARY GASTROENTEROLOGIST: Scot Junobert T. Elliott, MD  REASON FOR CONSULTATION: Melena, GI bleed.   HISTORY OF PRESENT ILLNESS: Mr. [name] is a 59 year old Caucasian male with a history of chronic alcoholic cirrhosis complicated by esophageal varices, ascites status post TIPS, hepatic encephalopathy and pancytopenia. He reports over the last 2 weeks he is noticing increased abdominal girth. He states he has gained over 20 pounds. He became short of breath. He did have some chest pressure and pressure on his upper abdomen where he has gained fluid. He has also been having dark stools for the last 10 days. He reports he quit drinking alcohol 15 years ago. He did recently move and has been eating a lot of [name] and ready-made meals. He was admitted with a hemoglobin of 5.2 and hematocrit 17. His platelets are 61. His ammonia is 68. His lipase was 397. He was started on a Protonix drip, octreotide at 50 mcg/hour. He is taking spironolactone, Lasix and Xifaxan at home. He is not skipping medications. He denies any new medications. He reports he had his last iron infusion 3 weeks ago. [facility]. Last EGD was 01/06/2013 by Dr. Mechele CollinElliott. It showed LA grade A esophagitis, no evidence of varices, gastric AVMs. He had a colonoscopy the same date which showed colonic AVMs, which were nonbleeding, and tubular adenomas and hyperplastic polyps were removed from the transverse and descending colon. He reports having a [facility]. He has had esophageal varices banded 6 times per the medical record. He denies any NSAID use. Two units of packed RBCs have been ordered by attending. He had negative cardiac markers. INR is 1.3.   PAST MEDICAL AND  SURGICAL HISTORY: Alcohol abuse (quit 15 years ago), chronic iron deficiency anemia/pancytopenia, decompensated alcoholic cirrhosis with ascites status [facility], esophageal varices with banding x 6, sleep apnea, hemorrhoids, gastric and colonic AVMs, LA grade A esophagitis on EGD by Dr. Mechele CollinElliott in July 2014, last colonoscopy with tubular adenomas 01/06/2013, hepatic encephalopathy, hypertension, diabetes mellitus.   MEDICATIONS PRIOR TO ADMISSION: Lantus insulin 30 units subcutaneous b.i.d., Lasix 40 mg b.i.d., Novolin R 22 units in the morning, 22 units at lunch and 25 units in the evening; omeprazole 40 mg b.i.d., spironolactone 50 mg 1/2 tablet daily, Xifaxan 550 mg b.i.d.   ALLERGIES: No known drug allergies.   FAMILY HISTORY: Father had lung cancer. Mother had gastric cancer, age 59.   SOCIAL HISTORY: History of heavy alcohol use, quitting 15 years ago. [name] with his wife. He denies any tobacco or illicit drug use.   REVIEW OF SYSTEMS: See HPI.  PULMONARY: He does have nonproductive cough, shortness of breath on exertion. Denies hemoptysis. Otherwise negative 12-point review of systems.   PHYSICAL EXAMINATION: VITAL SIGNS: Temperature 98.8, pulse 106, respirations 24, blood pressure 194/74.  GENERAL: He is a chronically ill-appearing, obese Caucasian male who is alert, oriented, pleasant and cooperative.  HEENT: Sclerae clear. Conjunctivae pale. Oropharynx pink and moist without any lesions.  NECK: Supple without any mass or thyromegaly. No JVD.  LUNGS: Clear to auscultation bilaterally with decreased breath sounds bilaterally. No acute distress.  ABDOMEN: Protuberant, distended. Tense  ascites. Small, easily reducible umbilicus hernia. Positive fluid wave. Unable to palpate hepatosplenomegaly given body habitus. No rebound tenderness or guarding.  EXTREMITIES: 2+ lower bilateral pretibial edema.  No cyanosis.  SKIN: Pale, warm and dry.  PSYCHIATRIC: He is alert, cooperative. Normal mood and affect.  NEUROLOGIC: Grossly intact.  MUSCULOSKELETAL: Good equal movement and strength bilaterally.   LABORATORY STUDIES: See HPI. PT 16.2, PTT 36.6. Total protein 5.4, albumin 2, otherwise normal LFTs. Glucose 399, BUN 21, chloride 109, calcium 8, otherwise normal basic metabolic panel.   IMAGING: Chest x-ray shows low-grade CHF, new density in the right lateral costophrenic angle.   IMPRESSION: [name] is a pleasant 59 year old Caucasian male, a patient of Dr. Mechele Collin, with history of decompensated alcoholic cirrhosis, hepatic encephalopathy, esophageal varices with banding x 6, pancytopenia, gastric arteriovenous malformations, colonic arteriovenous malformations and recurrent ascites, status post transjugular intrahepatic portosystemic shunt, who presents with a 10-day history of melena and shortness of breath. He has also had increased abdominal girth with ascites/anasarca. His hemoglobin is 5.2, and 2 units of packed RBCs have been ordered. His last EGD by Dr. Mechele Collin was 01/06/2013 and he did not have significant varices at that time; however, he did have nonbleeding gastric arteriovenous malformations and [place] grade A esophagitis. He has been banded approximately 6 times. He did have colonic arteriovenous malformations on his last colonoscopy. He has been noting anasarca and weight gain over the past 2 weeks with lack of dietary sodium discretion since he has recently moved and been eating a lot of Preston soups. I have discussed his care with Dr. Midge Minium. Dr. Mechele Collin will resume his care tomorrow per request.   PLAN: 1.  Agree with transfusion of packed RBCs.  2.  Agree with octreotide drip.  3.  Agree with b.i.d. IV Protonix.  4.  Ultrasound-guided LVAP planned for tomorrow per radiology with fluid for cultures and cell count with diff.  5.  Cipro 400 mg b.i.d.  6.  EGD, likely  tomorrow with Dr. Mechele Collin.   ____________________________ Joselyn Arrow, NP klj:jm D: 03/17/2013 21:29:00 ET T: 03/17/2013 21:56:52 ET JOB#: [id number]  cc: Joselyn Arrow, NP, <Dictator> Joselyn Arrow FNP ELECTRONICALLY SIGNED 04/09/2013 9:51

## 2014-10-09 NOTE — Consult Note (Signed)
CC: gi bleeding.  Pt had multiple AVM of stomach I treated with argon.  Can go home tomorrow if morning hgb reasonable.  Will start full liquid diet.  Electronic Signatures: Scot JunElliott, Robert T (MD)  (Signed on 02-Oct-14 16:58)  Authored  Last Updated: 02-Oct-14 16:58 by Scot JunElliott, Robert T (MD)

## 2014-10-09 NOTE — Discharge Summary (Signed)
PATIENT NAME:  [name], [name] MR#:  [id number] DATE OF BIRTH:  08-06-1955  DATE OF ADMISSION:  03/17/2013 DATE OF DISCHARGE:  03/21/2013   CONSULTANTS: Scot Jun, MD, from GI.   CHIEF COMPLAINT: Melena for 10 days, shortness of breath for a week.   DISCHARGE DIAGNOSES:  1. Acute on chronic hemorrhagic anemia. 2. Upper gastrointestinal bleed from multiple stomach bleeding angiectasias.  3. Thrombocytopenia.  4. Acute renal failure, now resolved.  5. History of cirrhosis.  6. History of hepatic encephalopathy.  7. Hypertension.  8. Diabetes.  9. Obstructive sleep apnea.  10. History of hemorrhoids.  11. History of history of esophageal varices.  12. History of transjugular intrahepatic portosystemic shunt procedure.  13. History of alcohol abuse, quit about 15 years ago.  14. History of esophageal varices, status post banding, type VI.  15. History of gastric and colonic arteriovenous malformations.   DISCHARGE MEDICATIONS:  1. Omeprazole 40 mg 2 times a day. 2. Novolin R 22 units in the morning, 22 units at lunch, 25 units in the evening.  3. Lantus 30 units 2 times a day. 4. Spironolactone 50 mg 1/2-tablet once a day. 5. Xifaxan 550 mg 2 times a day. 6. Lasix 40 mg 2 times a day. 7. Nadolol 20 mg once a day.   DIET: Low sodium, ADA diet, GI soft.   ACTIVITY: As tolerated.   FOLLOWUP INSTRUCTIONS: Please follow with a GI physician within 1 to 2 weeks. Please follow with Dr. Lorre Nick on Monday, as previously scheduled.   DISPOSITION: Home.   SIGNIFICANT LABORATORY AND IMAGING DATA: Initial hemoglobin is 5.2, platelet count of 61, WBC of 4.6. Initial troponin negative. Initial LFTs showed albumin of 2, protein was 5.4, otherwise they were within normal limits. Initial lipase 397. Ammonia was 68. BUN 21, creatinine 1.03, peak creatinine 1.42, peak BUN of 25, last creatinine of 1.14. Last hemoglobin of 8.7, last platelet count of 63. Initial INR was 1.3. Blood cultures on  arrival: No growth to date. PT was 16.2 on admission. X-ray of the chest, portable, 1-view, shows low-grade CHF, not a new finding. A new density in the right lateral costophrenic angle is suspected. A followup PA and lateral with deep inspiration would be of value. Moderate ascites on ultrasound.  HISTORY OF PRESENT ILLNESS AND HOSPITAL COURSE: For full details of H and P, please the dictation on September 29 by Dr. Imogene Burn, but briefly this is a 59 year old morbidly obese male with history of chronic anemia, history of GI bleed from AVMs as well as variceal bleed, status post banding in the past, who has cirrhosis, who has had TIPS in the past, who came in for melena, dark stools for the past 10 days, diffuse abdominal pain, nausea. He was noted to have low hemoglobin of 5. He was admitted to the hospitalist service, initially in the ICU for the acute GI bleed. It was not known if this was secondary to his AVMs of varices. This was likely upper, but the patient did not have any hematemesis or coughing up blood. He was started on Protonix drip as well as octreotide drip for possibility of esophageal variceal bleed. He was seen by GI. He was also started on some IV fluids. He was noted to have acute renal failure the next day. The patient did receive 4 units of PRBC, and his hemoglobin has been steady. He has had no further bleeding. In regards to his GI bleed, he did go for an endoscopy on the  2nd, and multiple bleeding angiectasias were found, which were treated. A hemoglobin was checked today, and it has been stable. He has had no further bleed, and at this point, the octreotide and Protonix drips have been stopped. He will be discharged on his outpatient PPI twice daily. He has an appointment with [name] on Monday for further care for his anemia. The GI bleed has apparently resolved. His renal function is back to normal. In regards to his abdominal pain, he was started on antibiotics, and paracentesis was  ordered; however, his platelets were low. I doubt he is having peritonitis as he has had no fevers, no significant tenderness to palpation of the abdomen, but he was treated with antibiotics, and at this point, they will be stopped. Although his ammonia level was high, this is not hepatic encephalopathy acutely as he has no confusion. At this point, he will be discharged with outpatient followup.   TOTAL TIME SPENT: 35 minutes.   CODE STATUS: The patient is a full code.   ____________________________ Preston EatonShayiq Izaia Say, MD sa:OSi D: 03/21/2013 11:34:47 ET T: 03/21/2013 11:49:43 ET JOB#: [id number]  cc: Preston EatonShayiq Jalyah Weinheimer, MD, <Dictator> Preston EatonSHAYIQ Quang Thorpe MD ELECTRONICALLY SIGNED 04/03/2013 14:15

## 2014-10-09 NOTE — H&P (Signed)
PATIENT NAME:  [name], [name] MR#:  [id number] DATE OF BIRTH:  Jan 11, 1956  DATE OF ADMISSION:  03/17/2013  PRIMARY CARE PHYSICIAN:  Nonlocal.  REFERRING PHYSICIAN:  Sharyn CreamerMark  Quale, MD  CHIEF COMPLAINT: Melena for 10 days and shortness of breath for 1 week.   HISTORY OF PRESENT ILLNESS: The patient is a 59 year old Caucasian male with a history of acute on chronic anemia, GI bleeding, pancytopenia, liver cirrhosis, presented to the ED with the above chief complaint. The patient is alert, awake, oriented, in no acute distress. The patient said he has had melena intermittently for the past 10 days. In addition, he has diffuse abdominal pain with tightness and nausea. He had loose stool and weakness. The patient developed shortness of breath and cough for the past 3 days so he came to the ED today for further evaluation. The patient was noted to have a low hemoglobin 5.0. Dr. Fanny BienQuale contacted Dr. Servando SnareWohl, GI physician. Dr. Fanny BienQuale ordered a PRBC transfusion and waiting for Dr. Annabell SabalWohl's consult. The patient denies any fever or chills. No headache or dizziness. No dysuria, hematuria or incontinence. The patient also has bilateral leg edema, he gained 20 pounds for the past 3 months.   PAST MEDICAL HISTORY 1.  Anemia.  2.  Pancytopenia.  3.  Splenomegaly.  4.  Hemorrhoid.  5.  Esophageal varices. 6.  Cirrhosis.  7.  Status post TIPS procedure.  8.  History of hepatic encephalopathy.  9.  Hypertension.  10.  Diabetes.   SOCIAL HISTORY: Denies any smoking, alcohol drinking or illicit drugs.   PAST SURGICAL HISTORY: TIPS.  The patient had esophageal banding 6 times in the past.    FAMILY HISTORY: Father had lung cancer, mother had throat cancer.   ALLERGIES:  No.  HOME MEDICATIONS 1.  Lantus 30 units subcu b.i.d.   2.  Lasix 40 mg p.o. b.i.d.  3.  Novolin R 20 units subcu once a day in the morning, 22 units once a day at lunchtime  and 25 units every evening.  4.  Omeprazole 40 mg p.o. b.i.d.  5.   Spironolactone 50 mg p.o., 0.5 tablets p.o. once a day.  6.  Xifaxan 550 mg p.o. b.i.d.   REVIEW OF SYSTEMS CONSTITUTIONAL: The patient denies any fever or chills. No headache or dizziness but has weakness.  EYES: No double vision or blurry vision.  ENT: No postnasal drip, slurred speech or dysphagia. No epistaxis.  CARDIOVASCULAR: No chest pain, palpitation, orthopnea or nocturnal dyspnea but has leg edema.  PULMONARY: Positive for cough, sputum, shortness of breath. No hemoptysis.  GASTROINTESTINAL: Positive for abdominal pain, nausea. No vomiting, but has loose stool, positive for melena but no bloody stool.  GENITOURINARY: No dysuria, hematuria or incontinence.  SKIN: No rash or jaundice.  HEMATOLOGY: No easy bruising or bleeding but has melena.   ENDOCRINE: No polyuria, polydipsia, heat or cold intolerance.  NEUROLOGY: No syncope, loss of consciousness or seizure.   PHYSICAL EXAMINATION VITAL SIGNS: Temperature 98.8, blood pressure 119/67, pulse 112, O2 saturation 99% on room air, respirations 24.  GENERAL: The patient is alert, awake, oriented, in no acute distress. Looks pale.  HEENT: Pupils are round, equal and reactive to light and accommodation. Very pale conjunctivae. Dry oral mucosa, clear pharynx.   NECK: Supple. No JVD or carotid bruits. No lymphadenopathy. No thyromegaly.  CARDIOVASCULAR: S1, S2, regular rate and rhythm. No murmurs or gallops.  PULMONARY: Bilateral air entry, no wheezing or rales but has very weak breath  sounds.  ABDOMEN: Distention, diffuse mild tenderness. No rigidity. No rebound. Ascites sign positive. It is difficult to estimate whether patient has organomegaly. Bowel sounds are week.  EXTREMITIES: Bilateral leg edema 2+. No clubbing or cyanosis. No calf tenderness. Bilateral pedal pulses present.  SKIN: No rash or jaundice.  NEUROLOGY: A and O x 3. No focal deficit. Power 5/5. Sensation intact.   LABORATORY  AND RADIOLOGIC DATA 1.  Chest x-ray shows  low-grade CHF, new density in the right lateral costophrenic angle is suspected.  2.  Troponin less than 0.02. CK 115, CK-MB 2.4.  3.  INR 1.6, PT 16.2, PTT 36.6.  4.  WBC 4.6, hemoglobin 5.2, platelets 61, MCV 68.  5.  Glucose 399, BUN 21, creatinine 1.03, sodium 139, potassium 3.9, chloride 109, bicarbonate 22. Lipase 397.  6.  EKG showed accelerated junctional arrhythmia at 113.   IMPRESSION 1.  Acute gastrointestinal bleeding, possibly due to varices. 2.  Severe anemia due to acute blood loss secondary to gastrointestinal bleeding.  3.  Abdominal pain, need to rule out bacterial peritonitis. 4.  Thrombocytopenia.  5.  Ascites.  6.  Liver cirrhosis.  7.  Hypertension.  8.  Diabetes.  9.  Obstructive sleep apnea.    PLAN OF TREATMENT 1.  The patient will be admitted to stepdown unit. We will keep n.p.o. except medications. We will start octreotide drip, Protonix drip and PRBC transfusion. Follow up CBC and blood culture.  2.  We will start Rocephin IV after blood culture. We will start Lasix 40 mg IV b.i.d., continue spironolactone, Xifaxan.  We will add nadolol. In addition, we will follow up GI consult from Dr. Servando Snare. Also, I will request ultrasound-guided paracentesis by radiologist.  3.  For diabetes, since the patient is kept on n.p.o. status, I will decrease the Lantus to 15 units  subcu b.i.d., hold Novolin R at this time and monitor blood sugar closely and adjust insulin dose.  4.  Deep vein thrombosis prophylaxis with TED.    5.  Patient may need a platelet transfusion for ultrasound-guided paracentesis if the radiologist requests.  6.  I discussed the patient's critical condition and the plan of treatment with the patient. The patient wants full code.   TIME SPENT: About 66 minutes.    ____________________________ Shaune Pollack, MD qc:cs D: 03/17/2013 14:07:52 ET T: 03/17/2013 14:27:04 ET JOB#: [id number]  cc: Shaune Pollack, MD, <Dictator> Shaune Pollack MD ELECTRONICALLY SIGNED  03/18/2013 12:50

## 2014-10-09 NOTE — Consult Note (Signed)
Pt CC GI bleeding with anemia.  Pt with ascites of significance.  Weight gain over last few months.  Need EGD for evaluation to see if varices returned which would indicate his TIPS may be closing.  Pt may and may not get tapped since his plt are somewhat low and PT elevated.  Empiric treatment with antibiotics appropriate since he may not be tapped.  Enterococcus is sometimes seen and not well covered with cipro.  VSS afebrile, hgb 9.9, alb 1.9, TB 1.5, creat 1.4, bun 24.  Electronic Signatures: Scot JunElliott, Robert T (MD)  (Signed on 30-Sep-14 17:12)  Authored  Last Updated: 30-Sep-14 17:12 by Scot JunElliott, Robert T (MD)

## 2014-10-09 NOTE — Consult Note (Signed)
Brief Consult Note: Diagnosis: GI bleed, cirrhosis, ascites.   Patient was seen by consultant.   Consult note dictated.   Comments: Mr. [name] is a pleasant 59 y/o caucasian male pt of Dr Elliott's with hx decompensated ETOH cirrhosis, HE, EV, pancytopenia, gastric AVMS & recurrent ascites s/p TIPS who presents with 10 day hx of melena & SOB.  Hgb 5.2 & 2 units PRBCs have been ordered.  Last EGD by Dr Mechele CollinElliott was 01/06/13 & he did not have significant varices at that time.  He did have nonbleeding gastric AVMs & LA grA esophagitis.  He has been banded approx 6 times.  He has significant anasarca & weight gain over past 2 weeks.  I have discussed his care with Dr Midge Miniumarren Wohl.  Dr Mechele CollinElliott will resume his care tomorrow per request.  Plan: 1)  Agree with transfusion PRBCS 2) Agree with octreotide gtt 3) Agree with BID IV Protonix 4) ultrasound guided LVAP planned for tomorrow per radiology with fluid for cultures & cell ct with diff 5) Cipro 400mg  BID 6) Likely EGD tomorrow with Dr Mechele CollinElliott  Please see full dictated note.  Thanks  for consult. #[id number].  Electronic Signatures: Joselyn ArrowJones, Terius Jacuinde L (NP)  (Signed 29-Sep-14 21:30)  Authored: Brief Consult Note   Last Updated: 29-Sep-14 21:30 by Joselyn ArrowJones, Rayley Gao L (NP)

## 2014-10-10 NOTE — Consult Note (Signed)
Pt seen and examined. Please see K. Arvilla MarketMills' notes. Pt with known hx of cirrhosis and recurrent ascites. Has had recurrent GI bleeding in the past. Now with signif anemia with hgb of only 5.6. Suspect UGI bleed again. Agree with blood transfusion. Start octreotide infusion after IV bolus. Will plan EGD tomorrow. If variceal bleeding, then esophageal banding will be done. If bleeding from portal gastrophy, may need APC coagulation. Will follow. Thanks.  Electronic Signatures: Lutricia Feilh, Cherelle Midkiff (MD)  (Signed on 12-Nov-15 18:39)  Authored  Last Updated: 12-Nov-15 18:39 by Lutricia Feilh, Kaelynn Igo (MD)

## 2014-10-10 NOTE — Consult Note (Signed)
Chief Complaint:  Subjective/Chief Complaint Patient with cirrhosis and an upper GI bleed. The Hb has been stable and no sign of further bleeding.   VITAL SIGNS/ANCILLARY NOTES: **Vital Signs.:   11-BZM-08 02:23  Systolic BP Systolic BP 361    22:44  Vital Signs Type Q 4hr  Temperature Temperature (F) 98.6  Celsius 37  Temperature Source oral  Pulse Pulse 67  Respirations Respirations 18  Systolic BP Systolic BP 975  Diastolic BP (mmHg) Diastolic BP (mmHg) 84  Mean BP 109  Pulse Ox % Pulse Ox % 96  Pulse Ox Activity Level  At rest  Oxygen Delivery Room Air/ 21 %   Brief Assessment:  GEN well developed, well nourished, no acute distress   Respiratory normal resp effort  no use of accessory muscles   EXTR negative cyanosis/clubbing, negative edema   Additional Physical Exam Alert and orientated times 3   Lab Results: Routine Chem:  15-Nov-15 04:24   Glucose, Serum  121  BUN  27  Creatinine (comp) 1.01  Sodium, Serum  146  Potassium, Serum 4.2  Chloride, Serum  115  CO2, Serum 24  Calcium (Total), Serum  7.0  Anion Gap 7  Osmolality (calc) 297  eGFR (African American) >60  eGFR (Non-African American) >60 (eGFR values <69m/min/1.73 m2 may be an indication of chronic kidney disease (CKD). Calculated eGFR, using the MRDR Study equation, is useful in  patients with stable renal function. The eGFR calculation will not be reliable in acutely ill patients when serum creatinine is changing rapidly. It is not useful in patients on dialysis. The eGFR calculation may not be applicable to patients at the low and high extremes of body sizes, pregnant women, and vegetarians.)  Result Comment CALCIUM - RESULTS VERIFIED BY REPEAT TESTING.  - C/TAMARA CONYERS/0515/05-03-14/[facility]  - NOTIFIED OF CRITICAL VALUE  - READ-BACK PROCESS PERFORMED. POTASSIUM/BUN/CREATININE - Slight hemolysis, interpret results with  - caution.  Result(s) reported on 03 May 2014 at 05:10AM.  Routine  Hem:  15-Nov-15 04:24   WBC (CBC) 4.3  RBC (CBC)  3.30  Hemoglobin (CBC)  7.8  Hematocrit (CBC)  24.6  Platelet Count (CBC)  55  MCV  74  MCH  23.5  MCHC  31.6  RDW  17.4  Neutrophil % 54.9  Lymphocyte % 29.0  Monocyte % 10.1  Eosinophil % 5.3  Basophil % 0.7  Neutrophil # 2.4  Lymphocyte # 1.2  Monocyte # 0.4  Eosinophil # 0.2  Basophil # 0.0 (Result(s) reported on 03 May 2014 at 09:56AM.)   Assessment/Plan:  Assessment/Plan:  Assessment Upper GI bleed.   Plan The patient wants to go home.  His Hb is stable.  [facility]   Electronic Signatures: WLucilla Lame(MD)  (Signed [id number])  Authored: Chief Complaint, VITAL SIGNS/ANCILLARY NOTES, Brief Assessment, Lab Results, Assessment/Plan   Last Updated: 15-Nov-15 10:07 by WLucilla Lame(MD)

## 2014-10-10 NOTE — Discharge Summary (Signed)
PATIENT NAME:  [name], [name] MR#:  [id number] DATE OF BIRTH:  11/16/1955  DATE OF ADMISSION:  04/30/2014 DATE OF DISCHARGE:  05/04/2014  DISCHARGE DIAGNOSES:  1.  Gastrointestinal bleed secondary to gastric antral vascular ectasias/gastric antral varices. 2.  Severe anemia, secondary to gastrointestinal bleed.  3.  Ascites secondary to cirrhosis, status post paracentesis.   4.  Alcoholic cirrhosis.  5.  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  6.  Anemia/thrombocytopenia.  7.  Hepatic encephalopathy.  8.  Type 2 diabetes mellitus.   MEDICATIONS:  1.  Omeprazole 40 mg p.o. b.i.d.  2.  Lantus 30 units subcutaneous b.i.d.  3.  Novolin R human recombinant insulin 23 units in the morning, 24 units in the afternoon, and 26 units in the night.  4.  Furosemide 20 mg in the morning, 40 mg at night.  5.  Nadolol 40 mg p.o. daily.  6.  Fluoxetine 20 mg p.o. daily.  7.  Rifaximin 550 mg p.o. b.i.d.  8.  Aldactone 100 mg p.o. daily.  9.  Lactulose 30 mL every 6 hours.  10.  Seroquel 100 mg p.o. daily.  11.  Senna 8.6 mg p.o. daily.  12.  Note that Seroquel has been recently by the hospice team.  13. The patient was discharged home, and he needs paracentesis every 2 weeks.   CONSULTATIONS: GI consult with Dr. Lutricia FeilPaul Oh. Hospice consult. Follow up with Dr. Lynnae Prudeobert Elliott for varix treatment in January and paracentesis every 2 weeks.   HOSPITAL COURSE: This is a 59 year old male patient with history of alcoholic cirrhosis, history of TIPS procedures before, comes in because of hemoglobin of 5.7. The patient was sent in from Dr. Earnest ConroyElliott's office because of hemoglobin 5.7. His usual hemoglobin is 8.8. The patient was noticed to have some dark stools for several weeks before he came, and no active fresh blood from stool, so he was admitted for severe anemia with acute on chronic GI bleed. The patient was admitted to the medical floor, started on  IV PPIs. He was kept n.p.o. and he is received 2 units of packed RBCs and he was started on Protonix drip. The patient had upper endoscopy done on 11/13. 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 The patient is a good candidate for varix treatment, and Dr. Bluford Kaufmannh suggested that, he was on Protonix and octreotide when he came, so we stopped the Protonix and octreotide. The patient did not have any further episodes of bleeding. The patient's hemoglobin was 7.8 on 05/03/2014 and platelets 55. The patient had a history of fall on 05/02/2014 at night, and was slightly confused, so we had physical therapy see the patient and we had ammonia levels drawn. The patient's ammonia level  was elevated at 160 on 05/03/2014, so we increased the lactulose. The patient was more alert and oriented. The patient tolerated the diet well, and repeat ammonia level came back to 76. He is supposed to go to hospice respite, but because he was admitted here, he preferred to go home instead of hospice, so we discharged him home and he is advised to take increased > dose of lactulose for 2-3 days a long with rifaximin, and he could continue Lasix, Aldactone, and nadolol for cirrhosis. The patient is given Seroquel 100 mg daily, started by the hospice team,.he is advised to follow up with Dr. Mechele CollinElliott every 2  weeks for a paracentesis. The patient is also advised to continue omeprazole twice a day.   PHYSICAL EXAMINATION AT THE TIME OF DISCHARGE:  CARDIOVASCULAR: S1, S2 regular.  LUNGS: Clear.  ABDOMEN: Distended with ascites. Bowel sounds present.  VITAL SIGNS: Heart rate 64, temperature 98.7, blood pressure 131/71, saturation 100% on room air.   PROCEDURE PERFORMED:  The patient did have a paracentesis on 04/30/2014 which removed 5 L of fluid.  TIME SPENT ON DISCHARGE PREPARATION: More than 30 minutes.     ____________________________ Katha Hamming, MD sk:MT D: 05/08/2014 14:08:46 ET T: 05/08/2014 16:25:22 ET JOB#: [id number]  cc: Katha Hamming, MD, <Dictator> Scot Jun, MD Katha Hamming MD ELECTRONICALLY SIGNED 05/11/2014 8:09

## 2014-10-10 NOTE — Consult Note (Signed)
PATIENT NAME:  [name], [name] MR#:  [id number] DATE OF BIRTH:  [birth year]  DATE OF CONSULTATION:  04/30/2014  REFERRING PHYSICIAN:  Shaune Pollack, MD   CONSULTING PHYSICIAN:  Wallace Cullens, MD, and Theadore Nan, ANP (Adult Nurse Practitioner)   REASON FOR CONSULTATION: Anemia with GI bleed.   HISTORY OF PRESENT ILLNESS: This 59 year old patient has a history of cirrhosis, pancytopenia, anemia, esophageal varices, came in today for a paracentesis secondary to ascites. He was found to have a hemoglobin of 5.7; his hemoglobin on 04/10/2014, was 8.8; the hemoglobin on 03/27/2014, was 10.8; this represents an acute drop for this patient. The patient was therefore admitted to the hospital.   The patient reports that he has had a lot of diarrhea for 1 or 2 weeks described as 3 to 5 movements per day. Stools alternate brown to black. He has been eating [place] bacon, regular soda drinks, and has gained some weight with edema. He has also noted over the last 3 to 4 weeks progressive weakness and he has not been able to go to hematology for IV iron. He feels tired, poor appetite, mild headache. The patient has noted no nausea or vomiting. He does have bruising but no bleeding of the gums or hematuria. He is experiencing a lot of heartburn, but no nausea, vomiting, or discrete pain. He denies chest pain, or shortness of breath. He is receiving his first of 2 unit blood transfusions. He says he is already feeling a little better.   PAST MEDICAL HISTORY: 1.  Cirrhosis status post TIPS procedure.   2.  History of hepatic encephalopathy, esophageal varices with previous banding.  3.  Anemia, pancytopenia.  4.  Splenomegaly.  5.  Hemorrhoids.  6.  Hypertension.  7.  Diabetes mellitus.  8.  Upper GI bleed from multiple stomach bleeding, angiectasias.  9.  History of acute renal failure.  10.  OSA.  11.  History of alcohol abuse, quit 15 years ago.   PAST SURGICAL HISTORY:  1.  TIPS.  2.  Esophageal banding x 6.   3.  Most recent upper endoscopy performed by Dr. Mechele Collin, 05/30/2013, for IDA, heme-positive stool, notable for multiple small angiectasias without bleeding found in the gastric antrum. Coagulation for tissue distraction using argon plasma performed; normal duodenum; normal esophagus.  4.  History of iron deficiency anemia and required IV iron transfusion and previous blood transfusions, he is followed by Dr. Lorre Nick.   HOME MEDICATIONS: 1.  Spironolactone 100 mg daily.  2.  Rifaximin 550 mg twice daily.  3.  Omeprazole 40 mg twice daily.  4.  Novolin R 25 units subcutaneous in the morning, 24 units in the afternoon, 26 units in the evening.  5.  Nadolol 40 mg daily.  6.  Lantus 30 units subcutaneous twice daily.  7.  Lasix 40 mg tablets, he takes 20 mg in the a.m. and 40 mg at bedtime.  8.  Fluoxetine 20 mg daily.  9.  The patient has been holding his lactulose 30 mL b.i.d. for the last month.   ALLERGIES: No known drug allergies.   HABITS: Previous alcohol, negative illicit drug use, negative tobacco. He quit alcohol abuse 15 years ago.   REVIEW OF SYSTEMS: Ten systems reviewed, pertinent positives noted in history of present illness.   The patient is followed by hospice. He was going to have a hospice respet, 5 days on Monday. He says he does that periodically for rest for his wife and for himself.  The patient does want to be a full code. He does want intubation, defibrillation and cardiac drugs. The patient states that he wants to have a chance. He finds that his quality of life is adequate.   PHYSICAL EXAMINATION: VITAL SIGNS: Temperature is 98, heart rate is 77, respirations 18, blood pressure 135/78, pulse ox on room air is 100%.  GENERAL: Elderly Caucasian male resting in bed visiting with his brother, he is extremely pale but gregarious.  HEENT: Shows head is normocephalic, conjunctivae is very pale, oral mucosa is dry and intact, pale tongue.  NECK: Supple. Trachea is midline.   CARDIAC: S1, S2, positive systolic murmur.  RESPIRATORY: Lungs are CTA, respirations are nonlabored.  ABDOMEN: Soft, he has a protuberant abdomen, mild distention, no tenderness. Right side dressing post paracentesis dry and intact, positive ascites, still present, 5 liters were removed today.  EXTREMITIES: With bilateral lower extremity edema noted.  SKIN: Warm and dry, multiple bruising's, tattoos, ecchymosis noted; he has petechiae telangiectasias.  NEUROLOGIC: Cranial nerves grossly intact. No asterixis noted. The patient is alert and oriented.  PSYCHIATRIC: Pleasant mood, affect is normal, pleasant mood, reasonable, and good insight, asking appropriate questions and showing a good memory with a past history.   LABORATORY: BUN 27, creatinine 1.0, sodium 143, potassium 4.2, calcium 7.6, WBC 3.0, hemoglobin 5.7, hematocrit 18.9, platelets 56,000, MCV 74.   RADIOLOGY: Ultrasound-guided paracentesis performed today successful yielding 5 liters of nonbloody fluid.   IMPRESSION: The patient with a history of alcoholic liver cirrhosis with pancytopenia, history of severe iron deficiency anemia, history of intermittent and acute drop in hemoglobin; also reporting recent upper gastrointestinal bleed. This is probably bleed from portal hypertensive gastropathy again. The patient had undergone EGD requiring gastric arteriovenous malformation   treatments; the patient does have history of esophageal varices with banding. He has had no vomiting or hematemesis. The patient has had progressive symptoms over the last month reporting more of a slow gastrointestinal bleed.   PLAN: 1.  Recommend pro time, INR check in the a.m. Begin  octreotide 50 mcg IV x 1 bolus. This is to be followed by 50 mcg/h octreotide infusion per order of Dr. Bluford Kaufmannh and Dr. Mechele CollinElliott who collaborated together.  2.  Dr. Bluford Kaufmannh will likely do the upper endoscopy tomorrow morning. The patient is receiving 2 units RBC and hopefully hemoglobin will be  around 8 in the morning. He does not complain of any cardiopulmonary problems today and his vital signs are stable. The patient seems to be tolerating the anemia.  This case was discussed with Dr. Bluford Kaufmannh, in collaboration of care.   Thank you for the consultation.   These services provided by Cala BradfordKimberly A. Arvilla MarketMills, MS, APRN, BC, ANP (Adult Nurse Practitioner) under collaborative agreement with Wallace CullensPaul Y Oh, MD.    ____________________________ Ranae PlumberKimberly A. Arvilla MarketMills, ANP (Adult Nurse Practitioner) kam:nt D: 04/30/2014 17:35:23 ET T: 04/30/2014 18:18:52 ET JOB#: [id number]  cc: Cala BradfordKimberly A. Arvilla MarketMills, ANP (Adult Nurse Practitioner), <Dictator> Ranae PlumberKimberly A. Suzette BattiestMills RN, MSN, ANP-BC Adult Nurse Practitioner ELECTRONICALLY SIGNED 05/01/2014 8:33

## 2014-10-10 NOTE — H&P (Signed)
PATIENT NAME:  [name], [name] MR#:  [id number] DATE OF BIRTH:  30-Jan-1956  DATE OF ADMISSION:  04/30/2014  PRIMARY CARE PHYSICIAN: Nonlocal.   REFERRING PHYSICIAN: Lynnae Prude, MD (Gastroenterology)  CHIEF COMPLAINT: Anemia with hemoglobin 5.7 today.   HISTORY OF PRESENT ILLNESS: A 59 year old Caucasian male with a history of anemia, pancytopenia, esophageal varices, and cirrhosis who came to the hospital for paracentesis due to ascites. The patient was noticed to have a low hemoglobin at 5.7. His previous hemoglobin was 8.8 so Dr. Mechele Collin suggested to admit the patient for anemia. The patient is alert, awake, oriented, in no acute distress. The patient said for the past several weeks he has been feeling tired, sometimes has shortness of breath. In addition, the patient has intermittent chest pain. He noticed some dark stool for the past several weeks, but he denies any active bleeding like bloody stool.   PAST MEDICAL HISTORY: Anemia, pancytopenia, splenomegaly, hemorrhoid, esophageal varices, cirrhosis, status post TIPS procedure, history of hepatitic encephalopathy, hypertension, diabetes, upper GI bleeding from multiple stomach bleeding, angiectasias, acute renal failure, OSA, and history of alcohol abuse, quit 15 years ago.   PAST SURGICAL HISTORY: TIPS and esophageal bandings, 6 times in the past.   FAMILY HISTORY: Father had lung cancer. Mother had throat cancer.   SOCIAL HISTORY: Denies any smoking, alcohol drinking or illicit drugs, but the patient has a alcohol abuse, quit 15 years ago.   ALLERGIES: No known drug allergies.   HOME MEDICATIONS: Spironolactone 100 mg p.o. daily, Rifaximin 550 mg p.o. b.i.d., omeprazole 40 mg p.o. b.i.d., Novolin R 23 units subcu in the morning, 24 every afternoon and 26 every evening, nadolol 40 mg p.o. daily, Lantus 30 units subcu b.i.d., lactulose 10 grams/15 mL oral syrup 30 mL p.o. b.i.d., Lasix 40 mg oral tablets 20 mg q. a.m. and 40 mg at  bedtime, fluoxetine 20 mg p.o. daily.   REVIEW OF SYSTEMS: CONSTITUTIONAL: The patient denies any fever or chills. No headache but has dizziness and generalized weakness.  EYES: No double vision or blurred vision.  ENT: No postnasal drip, slurred speech or dysphagia.  CARDIOVASCULAR: Intermittent chest pain. No palpitations, orthopnea, nocturnal dyspnea, but has leg edema.  PULMONARY: No cough or sputum, but has mild intermittent shortness of breath, on home oxygen p.r.n. No wheezing or hemoptysis.  GASTROINTESTINAL: No abdominal pain, nausea, vomiting, or diarrhea but has melena. No bloody stools, but the patient has abdominal distention due to ascites.  GENITOURINARY: No dysuria, hematuria, or incontinence.  SKIN: No rash or jaundice.  NEUROLOGY: No syncope, loss of consciousness, or seizure.  ENDOCRINE: No polyuria, polydipsia, heat or cold intolerance.  HEMATOLOGY: No easy bruising or bleeding.   PHYSICAL EXAMINATION: VITAL SIGNS: Temperature 98.2, blood pressure 134/80, pulse 75, O2 saturation 98% in room air.  GENERAL: The patient is alert, awake, and oriented, in no acute distress.  HEENT: Pupils round, equal, and reactive to light and accommodation. Very pale conjunctiva. Moist oral mucosa. Clear oropharynx.  CARDIOVASCULAR: S1, S2 regular rate and rhythm. No murmurs or gallops. PULMONARY: Bilateral air entry. No wheezing or rales. No use of accessory muscles to breathe.  ABDOMEN: Soft. Mild distention. Right side in dressing status post paracentesis with 5 liters fluid removal. Bowel sounds present. Difficult to estimate whether the patient has organomegaly. Ascites sign positive.  EXTREMITIES: Bilateral lower extremity edema, 1+. No clubbing or cyanosis. No calf tenderness.  NEUROLOGIC: Alert and oriented x3. No focal deficit. Power 5/5. Sensory intact.   LABORATORY DATA:  Glucose 274, BUN 27, creatinine 1, sodium 143, potassium 4.2, chloride 111, bicarb 27. WBC 3, hemoglobin 5.7,  platelets 56,000.   IMPRESSIONS: 1.  Severe anemia, possibly due to chronic gastrointestinal bleeding. 2.  Gastrointestinal bleeding.  3.  Ascites status post paracentesis.  4.  Liver cirrhosis.  5.  Pancytopenia due to liver cirrhosis.  6.  Hypertension.  7.  Diabetes.   PLAN OF TREATMENT: 1.  The patient will be admitted to medical floor. We will keep n.p.o., except medications and follow up with gastroenterology, Dr. Mechele CollinElliott, for possible endoscopy.  2.  For severe anemia, we will give PRBC 2 unit transfusion and follow up hemoglobin level, start Protonix bid. 3.  For liver cirrhosis, we will continue the patient's home medications including nadolol, lactulose, rifaximin. In addition, we will continue Lasix.  4.  For diabetes, we will start sliding scale, continue the patient's Lantus.   I discussed the patient's condition and plan of treatment with the patient and the nurse.   TIME SPENT: About 58 minutes.   ____________________________ Shaune PollackQing Adonia Porada, MD qc:sb D: 04/30/2014 13:28:35 ET T: 04/30/2014 14:26:25 ET JOB#: [id number]  cc: Shaune PollackQing Winda Summerall, MD, <Dictator> Shaune PollackQING Akansha Wyche MD ELECTRONICALLY SIGNED 05/01/2014 15:29

## 2014-10-10 NOTE — Consult Note (Signed)
Pt feels much better with 2 units of PRBC. hgb upto 8.5. this AM. EGD showed no esophageal varices. 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 No active bleedeing right now. Area cauterized with APC. Resume diet. Keep on octreotide rest of today and taper off tomorrow if no further bleeding. Pt good candidate for Barryx in the future. Dr. Servando SnareWohl to check on patient this weekend. thanks.  Electronic Signatures: Lutricia Feilh, Rasheed Welty (MD)  (Signed on 13-Nov-15 11:34)  Authored  Last Updated: 13-Nov-15 11:34 by Lutricia Feilh, Silver Achey (MD)

## 2014-10-10 NOTE — Consult Note (Signed)
Chief Complaint:  Subjective/Chief Complaint Patient with anemia and EGD showing GAVE. S/P treatment for this. Hb slightly down today but patient states he had a brown stool without any sign of bleeding.   VITAL SIGNS/ANCILLARY NOTES: **Vital Signs.:   14-Nov-15 01:11  Vital Signs Type Q 8hr    07:35  Vital Signs Type Q 8hr  Temperature Temperature (F) 97.7  Celsius 36.5  Temperature Source oral  Pulse Pulse 55  Respirations Respirations 18  Systolic BP Systolic BP 937  Diastolic BP (mmHg) Diastolic BP (mmHg) 63  Mean BP 84  Pulse Ox % Pulse Ox % 98  Pulse Ox Activity Level  At rest  Oxygen Delivery Room Air/ 21 %   Brief Assessment:  GEN well developed, well nourished   Respiratory normal resp effort  no use of accessory muscles   Gastrointestinal Ascites present.   Additional Physical Exam Alert and orientated times 3   Lab Results: Routine Chem:  14-Nov-15 04:26   Glucose, Serum 93  BUN  30  Creatinine (comp)  1.36  Sodium, Serum  146  Potassium, Serum 4.3  Chloride, Serum  113  CO2, Serum 26  Calcium (Total), Serum  7.0  Anion Gap 7  Osmolality (calc) 296  eGFR (African American) >60  eGFR (Non-African American)  57 (eGFR values <56m/min/1.73 m2 may be an indication of chronic kidney disease (CKD). Calculated eGFR, using the MRDR Study equation, is useful in  patients with stable renal function. The eGFR calculation will not be reliable in acutely ill patients when serum creatinine is changing rapidly. It is not useful in patients on dialysis. The eGFR calculation may not be applicable to patients at the low and high extremes of body sizes, pregnant women, and vegetarians.)  Result Comment CALCIUM - RESULTS VERIFIED BY REPEAT TESTING.  - NOTIFIED OF CRITICAL VALUE  - LAB/TRICIA KING AT 0540 05/01/14  - READ-BACK PROCESS PERFORMED.  Result(s) reported on 02 May 2014 at 05:28AM.  Routine Hem:  14-Nov-15 04:26   WBC (CBC) 5.9  RBC (CBC)  3.33   Hemoglobin (CBC)  7.9  Hematocrit (CBC)  24.4  Platelet Count (CBC)  66  MCV  73  MCH  23.6  MCHC 32.2  RDW  17.2  Neutrophil % 60.6  Lymphocyte % 25.5  Monocyte % 8.8  Eosinophil % 4.0  Basophil % 1.1  Neutrophil # 3.6  Lymphocyte # 1.5  Monocyte # 0.5  Eosinophil # 0.2  Basophil # 0.1 (Result(s) reported on 02 May 2014 at 05:28AM.)   Assessment/Plan:  Assessment/Plan:  Assessment GI bleed with anemia.   Plan S/P treatent of GAVE. Hb down but only slightly. Will recheck tomorrow. No sign of active bleeding.   Electronic Signatures: WLucilla Lame(MD)  (Signed [id number])  Authored: Chief Complaint, VITAL SIGNS/ANCILLARY NOTES, Brief Assessment, Lab Results, Assessment/Plan   Last Updated: 14-Nov-15 12:14 by WLucilla Lame(MD)

## 2014-10-10 NOTE — Consult Note (Signed)
Chief Complaint:  Subjective/Chief Complaint Feeling better and stronger. Able to ambulate in halls without falling last night. Hgb stable. No more melena.   VITAL SIGNS/ANCILLARY NOTES: **Vital Signs.:   16-Nov-15 07:35  Vital Signs Type Q 4hr  Temperature Temperature (F) 98.6  Celsius 37  Temperature Source oral  Pulse Pulse 66  Respirations Respirations 18  Systolic BP Systolic BP 149  Diastolic BP (mmHg) Diastolic BP (mmHg) 63  Mean BP 91  Pulse Ox % Pulse Ox % 98  Pulse Ox Activity Level  At rest  Oxygen Delivery Room Air/ 21 %   Brief Assessment:  GEN no acute distress   Cardiac Regular   Respiratory clear BS   Gastrointestinal distended with ascites.   Lab Results: Routine Chem:  16-Nov-15 08:01   Ammonia, Plasma  76 (Result(s) reported on 04 May 2014 at 08:54AM.)   Assessment/Plan:  Assessment/Plan:  Assessment Cirrhosis. UGI bleeding from GAVE. Stopped. Hgb stable.   Plan Stable for discharge later. Will sign off. Have patient f/u with Dr. Mechele CollinElliott. Barryx treatment in January. Continue paracentesis q 2weeks. Thanks.   Electronic Signatures: Lutricia Feilh, Maudine Kluesner (MD)  (Signed [id number]-Nov-15 09:51)  Authored: Chief Complaint, VITAL SIGNS/ANCILLARY NOTES, Brief Assessment, Lab Results, Assessment/Plan   Last Updated: 16-Nov-15 09:51 by Lutricia Feilh, Amijah Timothy (MD)

## 2014-10-11 NOTE — Consult Note (Signed)
VSS afebrile, color looks much better, hgb up to 7.3, plt 44, WBC 4.5.  Pt expresses he had crying spell over his illnesses.  His glucose is running in low 400's.  He passed a small amt of reddish blood and got greatly concerned and had the answering service page me for a dire emergency.  Exam of abd is soft and non tender.  Rectal exam showed a scant amt of reddish blood, likely from internal hemorrhoids.  These occur in cirrosis often.  Given his hgb and normal vital signs will treat this with suppositories.  His NH3 is up to 118, may interfere with congnition.  May go home tomorrow if stable.  Follow up with me and hematology.  Please check iron levels before discharge.    Electronic Signatures: Scot JunElliott, Sriram Febles T (MD)  (Signed on 22-Feb-13 12:31)  Authored  Last Updated: 22-Feb-13 12:31 by Scot JunElliott, Brinson Tozzi T (MD)

## 2014-10-11 NOTE — Consult Note (Signed)
Brief Consult Note: Diagnosis: pancytopenia, splenomegaly, cirrhosis, gastropathy, hemorrhoids, chronic iron deficiency.   Patient was seen by consultant.   Comments: see dictated note to follow  patient seen and evaluated, chart reviewed... 1. neutropenia chronic.Marland Kitchen.if fever developed would need antibiotics and would benefit from short course of neupogen.2. thrombocytopenia, baseline range 28k to low 40k,s..if active bleeding recurrs will need platelet transfusion , plus recheck protime,and add FFP if INR above 1.4, or if  not resolved with plt transfusion. 3. chronic IDA, by hx can not take po iron due to GI upset. would check iron studies but low mcv and IDA documented prior, looks like should plan for IV IRON TOMORROW PRIOR TO DISCHARGE  4. GI bleed, as per Dr Mechele CollinElliott, transfuse additional blood prn prior to discharge depending on am result  5. Would make appt to cancer center for cbc, and prn additional prbc and/or IV iron, 5 or 6 days post discharge.  Electronic Signatures: Marin RobertsGittin, Robert G (MD)  (Signed [id number]-Feb-13 19:43)  Authored: Brief Consult Note   Last Updated: 22-Feb-13 19:43 by Marin RobertsGittin, Robert G (MD)

## 2014-10-11 NOTE — Consult Note (Signed)
PATIENT NAME:  [name], [name] DATE OF BIRTH:  1955-09-04  DATE OF CONSULTATION:  08/11/2011  REFERRING PHYSICIAN:   CONSULTING PHYSICIAN:  Knute Neuobert G. Gittin, MD  TYPE OF CONSULTATION: Hematology  A note was placed on the chart. The full dictation was delayed until today.   HISTORY OF PRESENT ILLNESS: Mr. [name] is a 59 year old patient who was seen and evaluated at the request of GI on August 11, 2011. The patient has a history of cirrhosis, chronic GI bleed, iron-deficiency anemia, anemia due to blood loss, hypersplenism. He is unable to tolerate p.o. iron. He has had close GI followup. 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 He has had esophageal banding. He has had transjugular intrahepatic portosystemic stent (TIPS)  procedures. He is on followup for supportive care, mainly with blood transfusions and planning intravenous iron. Hematology is consulted to try and organize further outpatient blood and iron transfusions in order to avoid hospitalizations. This hospitalization, he also had rectal hemorrhoidal bleeding.   FAMILY HISTORY: Noncontributory.   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 Not a smoker.   ALLERGIES: No known allergies.   MEDICATIONS: Recently, vitamin K daily and omeprazole 40 mg, and Xifaxan 550 mg twice a day, nadolol 20 mg daily, lactulose 20 mg p.r.n., and Aldactone 50 mg daily, lactulose 15 mg t.i.d. Anusol suppositories were given p.r.n. for rectal bleeding this hospitalization, and he is on Lantus insulin subcutaneously 40 mg at bedtime and also 8 units for regular insulin 3 times a day before meals.   ADDITIONAL REVIEW OF SYSTEMS: When seen, the patient was fairly comfortable. He already had some blood transfusion support. He is planned for intravenous  iron prior to discharge and, as stated, he will not take p.o. iron. He was not having nausea or vomiting; no fever, chills, or sweats. He was not having any chest or abdominal pain or palpitations or retrosternal chest pain. He had, on admission, chest and abdominal pain with burning across the top of his abdomen radiating into his back; extreme fatigue when he presented with a hemoglobin of 4.4. He has no new bruising; he has bruises on the forearms and he does have low platelets. He has resolved the nausea and dry heaving that he had on admission. He has had some pruritus. There is a history of depression, but his mood was fairly positive when seen. Denies heat or cold intolerance. No visual disturbances. Fatigue was palliated. He is still slow-moving going to and from the bathroom, gaining strength compared to hospitalization admission.   PHYSICAL EXAMINATION:  SKIN: Still had pallor. No jaundice.   MOUTH: No thrush.   NECK: No palpable adenopathy.   LYMPH: No nodes in the neck, supraclavicular or submaxillary.   LUNGS: Clear. No wheezing or rales.   HEART: Regular. Had a left sternal border ejection murmur.   ABDOMEN: Protuberant. Was not tender. He had splenomegaly. He had abdomen consistent with fluid and ascites.   EXTREMITIES: He had extremity 2+ edema.   NEUROLOGIC: Grossly nonfocal.   LABORATORY DATA: As stated, his hemoglobin on admission was 4.4; platelets were 29; white count was 1.7. Review of old charts and old consultations showed the platelets ranged from 28 to 40. The creatinine was 0.81. The bilirubin was 1.3. The glucose has been variable; was 420 on admission.   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 His platelets and white count are really at baseline. He has stable labs that were reviewed, including in 2011, when I saw him in consultation. He has  neutropenia, but does not have infection.   PLAN: It was recommended:   1. If the patient were to have active bleeding, he would need platelet transfusions. Otherwise, no reason for maintenance transfusions. Plus, if he had active bleeding and if the prothrombin time was above 1.4, he should have fresh frozen plasma support.  2. For chronic anemia, he can be transfused for symptoms or p.r.n. but, to try and maintain his hemoglobin above 8 or try to get it to climb higher, would start a program of intravenous iron; a dose would be given prior to discharge and then would plan intravenous Venofer additionally in the [facility] and watch his iron stores and watch his reticulocyte count. Transfusion in the clinic as much as possible to avoid hospitalization. If the patient had fevers or infection, he could receive Neupogen support at some point in the future, though it was currently not indicated. He was to be seen within 5 or 6 days post discharge to begin this outpatient followup.     ____________________________ Knute Neu. Lorre Nick, MD rgg:al D: 09/04/2011 13:41:57 ET T: 09/04/2011 16:10:21 ET JOB#: [id number]  cc: Knute Neu. Lorre Nick, MD, <Dictator> Marin Roberts MD ELECTRONICALLY SIGNED 09/08/2011 13:43

## 2014-10-11 NOTE — H&P (Signed)
PATIENT NAME:  [name], [name] MR#:  [id number] DATE OF BIRTH:  1956/06/12  DATE OF ADMISSION:  08/24/2011  HISTORY OF PRESENT ILLNESS: Mr. Petrik is a 59 year old patient who is brought in for observation for packed red blood cell transfusions. He has a longstanding history of cirrhosis, chronic GI bleed, iron deficiency anemia, and anemia of blood loss. He was seen in the [facility] for evaluation for intravenous iron and for transfusion. His hemoglobin was 6.3 which was lower than earlier in the week when he had a blood transfusion so it was recognized that he may have ongoing bleeding although he reported no gross bleeding over the last three days, has persistent anemia, likely has shortened survival of blood cells and sequestration of blood in the large spleen, and is also somewhat sluggish although denied retrosternal chest pain, orthostasis, or shortness breath at rest. It is recognized that because of his high transfusion requirement, his potential to rebleed, and the extremely low hemoglobin, he warrants packed red blood cell transfusions currently. In addition, he was not felt to be safe as an outpatient or safe to go home until clinic space was available so we put in for observation and blood transfusion.   PAST MEDICAL HISTORY:  1. Cirrhosis, splenomegaly, and pancytopenia.  2. Sleep apnea. 3. Hypertension. 4. Hepatic encephalopathy.  5. History of esophageal banding and a TIPS procedure in the past.   FAMILY HISTORY: Noncontributory.  SOCIAL HISTORY: Not a smoker. He was a heavy alcohol user for years but none recently.  ALLERGIES: He has no known allergies.   CURRENT MEDICATIONS: 1. Vitamin K 1 capsule daily. 2. Omeprazole 40 mg twice a day. 3. Xifaxan 550 mg twice a day.  4. Nadolol 20 mg once a day at night.  5. Lasix 20 mg p.r.n. for edema. 6. Spironolactone 50 mg daily.  7. Lactulose 15 mL p.o. t.i.d.   8. Anusol suppositories p.r.n. for rectal discomfort or  bleeding. 9. Lantus 40 units subcutaneously once a day at bedtime plus 8 units of regular insulin subcutaneously 3 times a day just before meals.   ADDITIONAL SYSTEM REVIEW: He admits to being mentally sluggish. He has been ambulatory at home. He does not have orthostasis or shortness of breath at rest but he is intolerant of exertion. He has abdominal distention. This has been static and he has not had any blood per rectum in the last three days. No vomiting. Stools are loose from lactulose. He did not have cough or wheezing. He has not had increased but he has some static lower extremity edema. He does not have any increased bruising, gum bleeding, or epistaxis.   PHYSICAL EXAMINATION:   GENERAL: He is alert, cooperative, oriented but mentally sluggish with pallor. No jaundice of the sclerae.   LYMPH: No palpable lymph nodes in the neck or supraclavicular area.   LUNGS: Clear but decreased at both bases.   HEART: Regular. Left sternal border murmur.   ABDOMEN: Slightly protuberant. Palpable spleen. He has a fluid wave but the abdomen is not tense.   NEUROLOGIC: Grossly nonfocal. Cranial nerves are intact. He does not have significant ecchymosis or rash.   LABORATORY, DIAGNOSTIC, AND RADIOLOGICAL DATA: The hemoglobin in the clinic was 6.3, later in the hospital 6.5. Sodium 143, potassium 4.1, glucose 257, white count 3.0, 1200 neutrophils, was at 26,000. These are near baseline.   IMPRESSION AND PLAN:  1. The patient is admitted to be transfused 2 units of packed red blood cells with some Tylenol,  Benadryl, and Lasix before each unit and wait two hours between units.  2. Watch for bleeding.  3. Given his usual medicines and insulin, watch his blood sugar.  4. Follow-up his hemoglobin in the morning. Consider if he needs additional transfusion but if his hemoglobin is brought up over 8 grams or close to that, he recently went home from the hospital with 7.5 gram hemoglobin. 5. Will also  plan to give another intravenous dose of Venofer and continue outpatient follow-up hopefully to prevent further hospitalizations with transfusion support in the clinic.    ____________________________ Knute Neuobert G. Lorre NickGittin, MD rgg:drc D: 08/24/2011 21:41:40 ET T: 08/25/2011 05:52:40 ET JOB#: [id number]  cc: Knute Neuobert G. Lorre NickGittin, MD, <Dictator> Marin RobertsOBERT G GITTIN MD ELECTRONICALLY SIGNED 08/25/2011 8:38

## 2014-10-11 NOTE — Consult Note (Signed)
PATIENT NAME:  [name], Kendryck MR#:  [id number] DATE OF BIRTH:  01-07-1956  DATE OF CONSULTATION:  08/10/2011  REFERRING PHYSICIAN:   CONSULTING PHYSICIAN:  Scot Junobert T. 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 [facility] and had a TIPS procedure done over a year ago and has done extremely well compared to previous experience that he has had. He was admitted to the hospital in the fall of last year, 04/24/2011 and discharged 04/28/2011, for severe anemia, pancytopenia, and coagulopathy. Upper endoscopy at that time showed no evidence of varices. He did have portal hypertensive gastropathy. He has not been seen for a couple of months in the office. He came to the ER because his wife said he looked very pale. In the emergency room today, he had a hemoglobin in the 4 range and a plan was made to admit him to hospital. I was asked to see him in consultation.   REVIEW OF SYSTEMS: He has some chills and some cough like from a respiratory infection. There is nausea but no vomiting. No out and out fever. No hard chills, except some chills at night. The heating in the house is bad, he said. There is no rash. He has occasional headaches. He is swallowing okay, but his appetite is down some. His stools are regular. No rectal bleeding. No melena. No hematemesis. Does have some mild swelling of his abdomen. Generalized mild discomfort, especially in the broad periumbilical area   OTHER MEDICAL PROBLEMS:  1. History of diabetes for about 10 years.  2. Alcoholic cirrhosis.  3. History of hypertension.  4. Obstructive sleep apnea.  5. Portal hypertensive gastropathy.  6. Hypersplenism producing severe thrombocytopenia and probably contributing to pancytopenia.  7. His last ultrasound of the abdomen, done 04/26/2011,  showed gallstones in the gallbladder and some wall thickening, but no pericholecystic fluid. There was marked splenomegaly.   MEDICATIONS ON ADMISSION:  1. Spironolactone 100 mg a day.  2. Lasix 20 mg a day.  3. Lantus insulin 40 units twice a day.  4. Vitamin K. 5. Novolin sliding scale. 6. Lactulose 3 tablespoons three to four times a day.  7. Omeprazole 40 mg once or twice a day. 8. Xifaxan 550 mg a day.  9. Nadolol 20 mg daily.  PHYSICAL EXAMINATION:   GENERAL: White male, very pale looking, somewhat protuberant abdomen, in no acute distress.  VITALS: Pulse 110, respirations 20, blood pressure 136/43, temperature 98.4.   HEENT: Sclera anicteric. Conjunctivae pale. Tongue is pale. He is pale all over. Head is atraumatic. Trachea is in the midline. The patient appears to be no severe acute distress.   LUNGS: Normal to percussion.  Somewhat global decreased air flow that I can hear.   HEART: No murmurs or gallops I can hear.   ABDOMEN: Very soft and nontender. Bowel sounds are present. There is an enlarged spleen. Liver is not palpable. The abdomen is soft and easily pressed in, more like gaseous distention rather than ascites distention.   EXTREMITIES: Trace edema.   RECTAL: Exam not done at this time.   LABS/STUDIES: Glucose 420, BUN 13, creatinine 0.8, sodium 140, potassium 4.2, chloride 107, CO2 21, calcium 7.8, total protein 6.1, albumin 2.6, total bilirubin 1.3, alkaline phosphatase 121, SGOT 34, SGPT 15. CPK, MB, and troponins are negative.  White blood count 1.7, hemoglobin 4.4, platelet count 29, MCV 59, MCH 17.3. Antibody screen is negative. B-positive blood. Pro time 17. 9.  Chest x-ray, PA and lateral: No acute changes.   ASSESSMENT AND RECOMMENDATIONS: Severe hypochromic microcytic anemia with pancytopenia. He probably is severely iron deficient. There is no historical evidence of bleeding at this time. He has not seen any melena or bright red blood. Given his normal  endoscopy in November, three to four months ago, except for portal hypertensive gastropathy, I do not think we need to repeat his endoscopy at this time. I would however go ahead and empirically give him Protonix drip and drip to reduce portal pressure. I would cover him empirically with antibiotics for now. Agree with transfusions of blood and platelets and probably may consider iron IV supplementation as well. He will probably need to get hooked up with the Cancer Center for periodic transfusions to keep him from coming in the hospital. I will follow with you.  ____________________________ Scot Jun, MD rte:slb D: 08/10/2011 14:46:44 ET T: 08/10/2011 15:13:25 ET JOB#: [id number]  cc: Scot Jun, MD, <Dictator> Knute Neu. Lorre Nick, MD Scot Jun MD ELECTRONICALLY SIGNED 08/23/2011 14:49

## 2014-10-11 NOTE — Discharge Summary (Signed)
PATIENT NAME:  [name], [name] MR#:  [id number] DATE OF BIRTH:  [birth year]  DATE OF ADMISSION:  08/10/2011 DATE OF DISCHARGE:  08/12/2011  ADMITTING PHYSICIAN: Alford Highland, MD  DISCHARGING PHYSICIAN: Enid Baas, MD  PRIMARY CARE PHYSICIAN: [facility]   PRIMARY GASTROENTEROLOGIST: Lynnae Prude, MD  CONSULTANTS:  1. Lynnae Prude, MD - Gastroenterology. 2. Benita Gutter, MD - Oncology.  DISCHARGE DIAGNOSES:  1. Acute on chronic anemia requiring 3 units of packed RBC transfusion this admission, hemoglobin at time of discharge is 7.5.  2. Pancytopenia.  3. 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  4. Iron deficiency anemia likely from slow fluid gastrointestinal bleeding.  5. Hemorrhoidal rectal bleed.  6. History of esophageal varices, but most EGD showing no varices. 7. History of liver cirrhosis status post TIPS procedure.  8. History of hepatic encephalopathy.  9. Insulin-dependent diabetes mellitus.  DISCHARGE HOME MEDICATIONS:  1. Prilosec 40 mg p.o. twice a day. 2. Xifaxan 550 mg p.o. twice a day.  3. Nadolol 20 mg p.o. daily.  4. Lasix 20 mg p.o. daily.  5. Aldactone 50 mg p.o. daily.  6. Lactulose 10 grams/15 mL - 30 mL p.o. three times daily. 7. Anusol suppository 25 mg twice a day per rectum.  8. Lantus 40 units subcutaneous at bedtime. 9. Aspart insulin 8 units three times daily prior to meals subcutaneous.   DISCHARGE DIET: Low sodium, ADA diet.   DISCHARGE ACTIVITY: As tolerated.   FOLLOWUP INSTRUCTIONS:  1. Follow up with the Cancer Center, Dr. Lorre Nick, in 5 to 6 days.  2. Gastroenterology follow-up in 1 to 2 weeks.  3. Primary care physician follow-up in two weeks.   DISCHARGE LABS/STUDIES: WBC 3.7, hemoglobin 7.5, hematocrit 23.7, platelet count 36, MCV 67.   Plasma ammonia 80. Iron-binding capacity 374. Ferritin is low at 6. Retic count is 3.5, absolute reticulocyte count is 0.123. Sodium 144, potassium 4.4, chloride  109, bicarbonate 23, BUN 14, creatinine 1.01, glucose 281, calcium 7.9. Cardiac enzymes have remained negative.   Blood cultures are also negative.   Hemoglobin at the time of admission was 4.4, platelet count 29,000, and hematocrit 15.1. INR was 1.4. LDH 195. His iron level and total iron-binding capacity level have been drawn and are pending at the time of discharge.   BRIEF HOSPITAL COURSE: Mr. [name] is a 58 year old gentleman with past medical history of liver cirrhosis status post TIPS procedure, pancytopenia, splenomegaly, and cirrhosis requiring blood transfusion in the past who presented to the hospital with generalized weakness and chest pain and was found to have a hemoglobin of 4.4 and platelet count of 29,000. He was admitted to telemetry was transfused with 3 units of packed RBCs and his hemoglobin came up to 7.5. He feels a little bit stronger and much better. He has had this problem several times in the past and getting admitted for blood transfusions. So he was seen by oncology at this time and has been set up the [facility] for outpatient blood count check and transfusion as needed. The patient's platelets were also very low on admission so prophylactically he was given 1 unit of platelets also. He was placed on an octreotide drip and also Protonix drip, and he was seen by Dr. Mechele Collin. He has probably severe iron deficiency with slow gastrointestinal bleed. No current variceal bleed, with recent endoscopy showing no varices, but has portal hypertensive gastropathy. He is not actively bleeding and no nausea, vomiting, hematemesis, or melena. So per Gastroenterology recommendations, he is  being discharged on Prilosec and will see Dr. Mechele CollinElliott as an outpatient. He will need p.r.n. transfusions when his hemoglobin is low, platelets are low, or if INR is high he will need FFP. The patient's cirrhotic symptoms have improved, according to him, after the TIPS procedure was done about one year ago at  [facility]. He was recommended for follow-up over there too.   He is also on Nadolol, Xifaxan, Lactulose, Lasix, and Aldactone for his cirrhosis and history of hepatic encephalopathy. His ammonia level was 118 and that has improved after lactulose was restarted. Because of his iron deficiency anemia, his iron level though is pending, he did receive one dose of 200 mg of iron sucrose prior to discharge. The patient is intolerant to p.o. iron. He had an episode of rectal bleed while in the hospital and per Gastroenterology was probably hemorrhoidal bleed because of his variceal history. They recommended Anusol suppository twice a day, which was started in the hospital.   Insulin-dependent diabetes mellitus. He is on Lantus 40 units subcutaneous daily and also aspart insulin prior to meals three times a day. His sugars have been better controlled after the Lantus dose was increased to his home dose. (Initially it was reduced as he was NPO). His course has been otherwise uneventful in the hospital.   DISCHARGE CONDITION: Stable.   DISCHARGE DISPOSITION: Home.   TIME SPENT ON DISCHARGE: 40 minutes. ____________________________ Enid Baasadhika Basilio Meadow, MD rk:slb D: 08/12/2011 12:58:12 ET T: 08/13/2011 13:41:15 ET JOB#: [id number]  cc: Enid Baasadhika Zyere Jiminez, MD, <Dictator> Scot Junobert T. Elliott, MD Knute Neuobert G. Lorre NickGittin, MD Enid BaasADHIKA Charvez Voorhies MD ELECTRONICALLY SIGNED 08/18/2011 10:43

## 2014-10-11 NOTE — H&P (Signed)
PATIENT NAME:  [name], [name] MR#:  [id number] DATE OF BIRTH:  [birth year]  DATE OF ADMISSION:  08/10/2011  PRIMARY CARE PHYSICIAN:  Dr. Lynnae Prudeobert Elliott  CHIEF COMPLAINT: Chest and abdominal pain.   HISTORY OF PRESENT ILLNESS: This is a 59 year old man who has been having lots of chest and abdominal pain. He has not been eating much. His chest pain is worse with cough. He is coughing at night. He describes his abdominal pain as 7/10 in intensity, constant, burning across the top of his abdomen to his back. He has been feeling weak and tired. In the ER his hemoglobin was found to be 4.4 and platelet count 2.9, white blood cell count of 1.7. He was found to have guaiac-positive brown stool in the Emergency Room and hospitalist services were contacted for further evaluation.   PAST MEDICAL HISTORY:  1. Liver cirrhosis. 2. 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  3. Anemia.  4. Pancytopenia.  5. Hypertension.  6. History of esophageal varices in the past.  7. Sleep apnea.  8. Diabetes.  9. Gastroesophageal reflux disease.   PAST SURGICAL HISTORY:  1. TIPS procedure. 2. Esophageal banding 6 times in the past.   ALLERGIES: No known drug allergies.   SOCIAL HISTORY: No smoking. No alcohol or drug use. Did use alcohol in the past. 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   FAMILY HISTORY: Father had lung cancer. Mother with throat cancer.   MEDICATIONS: As per last discharge summary:  1. Spironolactone 50 mg daily.  2. Lasix 20 mg daily.  3. Lantus 40 units at bedtime.  4. Vitamin K, he ran out of. 5. Novolin sliding scale 3 times a day.  6. Lactulose 30 mL 3 times a day.  7. Omeprazole 40 mg twice a day. 8. Xifaxan 550 mg twice a day.  9. Nadolol 20 mg at bedtime.   REVIEW OF SYSTEMS: CONSTITUTIONAL: Positive for chills. No sweats. Positive for fatigue. No fever. Possible weight gain. EYES: No issues with his eyes. ENT: Positive for sore throat. No difficulty swallowing.  CARDIOVASCULAR: Positive chest pain with coughing. No palpitations. RESPIRATORY: Positive for shortness of breath. Positive for cough. No sputum. No hemoptysis. GASTROINTESTINAL: Positive for nausea with dry heaving. No hematemesis. No blood that he sees in the bowel movements. Positive for abdominal pain. No melena. No bright red blood per rectum. GENITOURINARY: No burning on urination or hematuria.  MUSCULOSKELETAL: No joint pain or muscle pain. INTEGUMENT: Positive for itching at night. NEUROLOGIC: No fainting or blackouts. PSYCHIATRIC: Positive for depression. ENDOCRINE: No thyroid problems. HEMATOLOGIC/LYMPHATIC: Positive for pancytopenia.   PHYSICAL EXAMINATION:  VITAL SIGNS: Temperature 98.4, pulse 105, respirations 20, blood pressure 134/56, pulse oximetry 97%.   GENERAL: No respiratory distress.   EYES: Conjunctivae are pale. Lids normal. Pupils equal, round, and reactive to light. Extraocular muscles intact. No nystagmus.   EARS, NOSE, MOUTH, AND THROAT: Tympanic membranes obscured by wax. Nasal mucosa no erythema. Throat no erythema. No exudate seen. Lips and gums no lesions.   NECK: No JVD. No bruits. No lymphadenopathy. No thyromegaly. No thyroid nodules palpated.   LUNGS: Lungs are clear to auscultation. No use of accessory muscles to breathe. No rhonchi, rales, or wheeze heard.   HEART: S1, S2 normal. No gallops or rubs. +2/6 systolic ejection murmur. Carotid upstroke 2+ bilaterally. No bruits. Dorsalis pedis pulses 1+ bilaterally. 2+ edema bilateral lower extremities.   ABDOMEN: Soft. Slight tenderness in the epigastric area. Positive for splenomegaly. No  masses felt.   LYMPHATIC: No lymph nodes in the neck.   MUSCULOSKELETAL: 2+ edema. No cyanosis.   SKIN: No ulcers or lesions seen. Pale-looking.   NEUROLOGIC: Cranial nerves II through XII grossly intact. Deep tendon reflexes 2+ bilateral lower extremities.   PSYCHIATRIC: The patient is oriented to person, place, and  time.   LABORATORY, DIAGNOSTIC, AND RADIOLOGICAL DATA: EKG showed normal sinus rhythm, 112 beats per minute, nonspecific ST-T wave changes. White blood cell count 1.7, hemoglobin and hematocrit 4.4 and 15.1, platelet count of 29, glucose 420, BUN 13, creatinine 0.81, sodium 140, potassium 4.2, chloride 107, CO2 21, calcium 7.8, total bilirubin 1.3. Liver function tests: Albumin low at 2.6, total protein 6.1. Other liver function tests normal. INR 1.4. Troponin negative.   ASSESSMENT AND PLAN:  1. Severe anemia, most likely slow GI bleed: With the patient's history of esophageal varices in the past we will put on octreotide and Protonix drips, consult GI, Dr. Mechele Collin, empiric Levaquin at this point. We will start off by transfusing 2 units of packed red cells. Benefits and risks of transfusion were explained to the patient. The patient has received transfusions before in the past and is willing to undergo the risks. All questions answered. We will try to keep hemoglobin around 7-8 range at this point. We will start off with 2 units and check a hemoglobin afterwards.  2. Thrombocytopenia with chronic GI bleed: We will give a unit of platelets. The patient has a history of pancytopenia.  3. Abdominal pain and chest pain, mostly likely secondary to the severe anemia: We will put on telemetry. Get serial cardiac enzymes. No anticoagulation with history of anemia and gastrointestinal bleed. We will continue IV Protonix and give empiric Levaquin, which would even cover SBP. Platelets are too low so unable to do a tap at this point.  4. Cirrhosis with multiple medical issues complicating: The patient is on nadolol, Xifaxan, lactulose, Lasix, and spironolactone.  5. History of hepatic encephalopathy on Xifaxan and lactulose: We will check an ammonia level in the a.m.  6. Diabetes: We will lower the dose of Lantus to 10 units at bedtime and put on sliding scale since the patient will be n.p.o. initially. 7. CODE  STATUS:  The patient is a FULL CODE.  Of note, the patient may benefit from followup at the [facility] as an outpatient for closer followup of blood counts to avoid hospitalizations in the future.  TIME SPENT ON ADMISSION: 55 minutes.      ____________________________ Herschell Dimes. Renae Gloss, MD rjw:bjt D: 08/10/2011 13:10:38 ET T: 08/10/2011 13:20:12 ET JOB#: [id number]  cc: Herschell Dimes. Renae Gloss, MD, <Dictator> Scot Jun, MD Salley Scarlet MD ELECTRONICALLY SIGNED 08/11/2011 16:05

## 2014-10-18 NOTE — Op Note (Signed)
PATIENT NAME:  [name], [name] MR#:  [id number] DATE OF BIRTH:  15-Dec-1955  DATE OF PROCEDURE:  07/24/2014  PREOPERATIVE DIAGNOSIS: Massive ascites secondary to liver failure.   POSTOPERATIVE DIAGNOSIS: Massive ascites secondary to liver failure.   OPERATION: Insertion of tunneled peritoneal catheter.  SURGEON: Kathreen CosierS. G. Delayza Lungren, M.D.   ANESTHESIA: Local with 1% Xylocaine and sedation and monitoring by anesthesia.   COMPLICATIONS: None.   ESTIMATED BLOOD LOSS: Minimal.   DRAINS: Peritoneal catheter.   DESCRIPTION OF PROCEDURE: The patient was placed in the supine position on the operating table. Ultrasound was performed to locate in the right mid and lower abdominal area the location of the ascitic fluid and this area was marked on the skin for the catheter entrance site and also a second exit site for the catheter 5 cm superior to this. Following this, this area was prepped and draped out. Local anesthetic was instilled at the 2 spots in the intervening space for  tunneling the catheter. Two 1 cm incisions were made at these 2 sites, and the catheter was then tunneled through to the catheter entrance site inferiorly, and the cuff placed right under the skin at the exit site. Ultrasound was then used and a needle with an Angiocath was positioned into the peritoneal cavity at the site of the fluid with free withdrawal of ascitic fluid. Guidewire was positioned and the Seldinger technique was then used and subsequently the catheter positioned going into the right lower quadrant area and the outer sheath was removed. The inferior incision was closed with a subcuticular 3-0 Monocryl stitch and a 3-0 Monocryl stitch was also used to snug up the exit site incision. The catheter was then connected to suction and slowly drained 4 liters of ascitic fluid. The catheter was then disconnected from suction and a cap was placed and dressed with the provided dressing. The patient subsequently was returned to the  recovery room in stable condition.    ____________________________ S.Wynona LunaG. Teighlor Korson, MD sgs:sb D: 07/24/2014 14:10:29 ET T: 07/24/2014 14:41:24 ET JOB#: [id number]  cc: S.G. Evette CristalSankar, MD, <Dictator>  [facility] Wynona LunaG Sherree Shankman MD ELECTRONICALLY SIGNED 07/25/2014 10:09

## 2015-01-17 IMAGING — US US GUIDE NEEDLE - US PARA
1 series · 3 of 3 positions shown · non-contrast
Comparison: 06/24/2013.

CLINICAL DATA: Ascites.

EXAM:
ULTRASOUND GUIDED  PARACENTESIS

[Series 1: us guide needle - us para · 0.27mm/px · 3 of 3 slices shown]
[im 1/3]
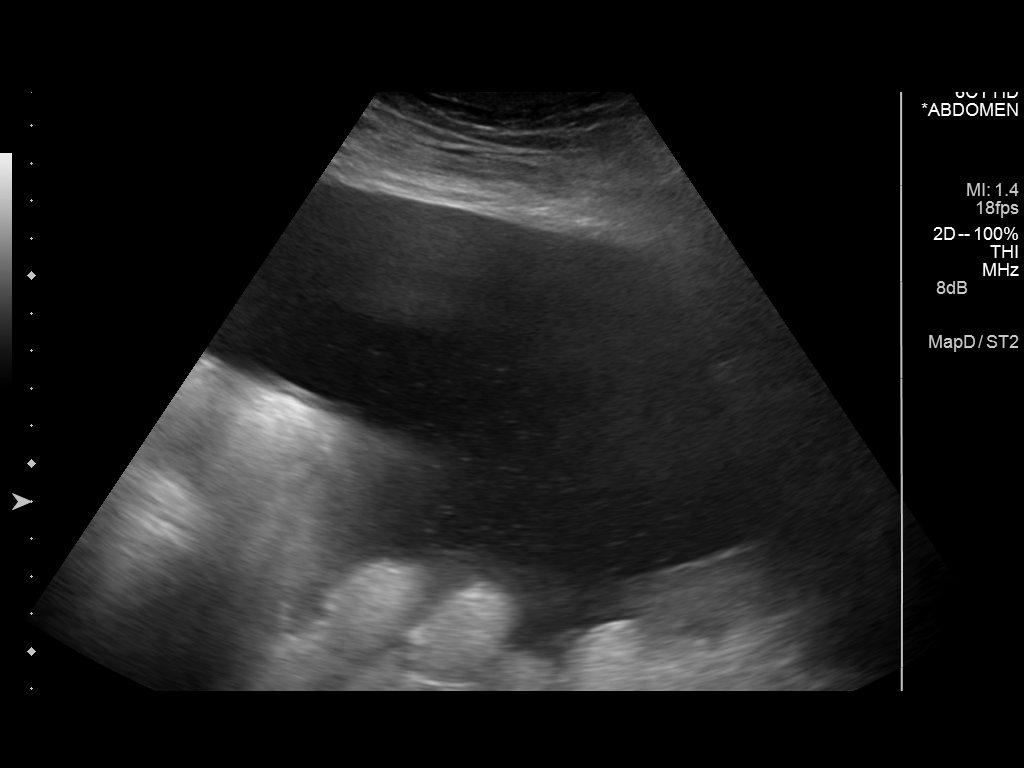
[im 2/3]
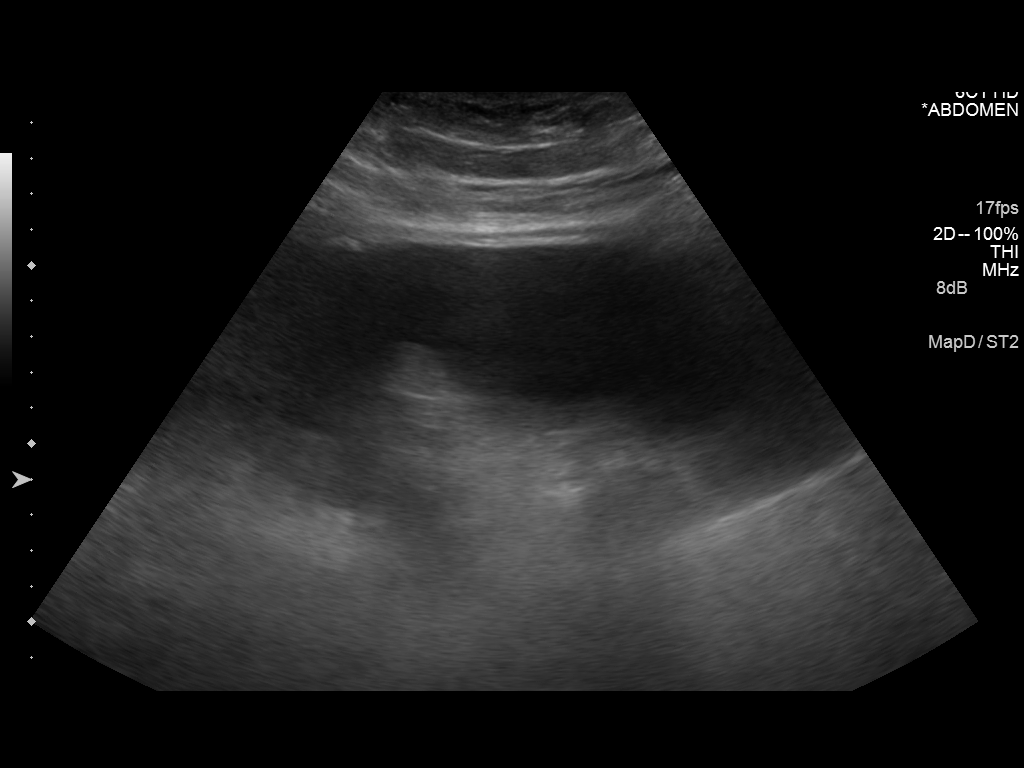
[im 3/3]
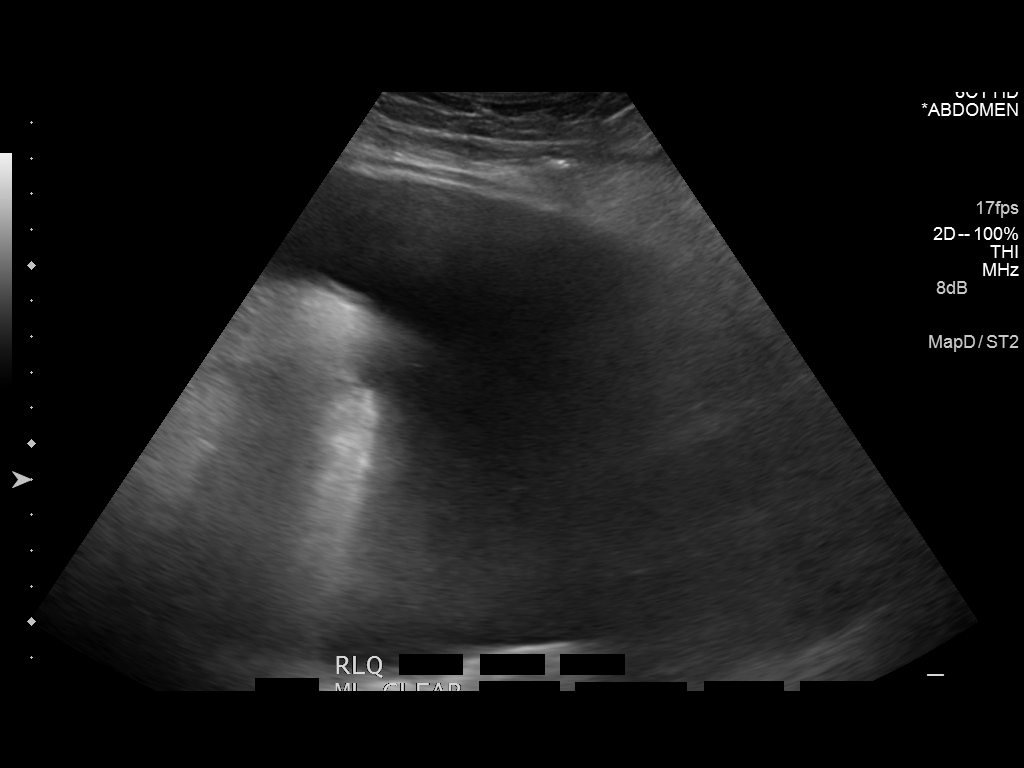

[3 of 3 positions shown; findings below may reference images not displayed]

PROCEDURE:
An ultrasound guided paracentesis was thoroughly discussed with the
patient and questions answered. The benefits, risks, alternatives
and complications were also discussed. The patient understands and
wishes to proceed with the procedure. Written consent was obtained.

Ultrasound was performed to localize and mark an adequate pocket of
fluid in the right lower quadrant of the abdomen. The area was then
prepped and draped in the normal sterile fashion. 1% Lidocaine was
used for local anesthesia. Under ultrasound guidance a 6 French
Safe-T-Centesis catheter was introduced. Paracentesis was performed.
The catheter was removed and a dressing applied.

Complications: None.
FINDINGS: A total of approximately 5 L of straw-colored clear fluid was
removed.
IMPRESSION: Successful ultrasound guided paracentesis yielding 5 L of ascites.

## 2015-01-18 DEATH — deceased

## 2015-06-22 ENCOUNTER — Telehealth: Payer: Self-pay | Admitting: *Deleted

## 2015-11-16 IMAGING — US US GUIDE NEEDLE - US PARA
1 series · 8 of 8 positions shown · non-contrast
Comparison: None.

INDICATION: Recurrent symptomatic ascites.

EXAM:
ULTRASOUND-GUIDED PARACENTESIS
TECHNIQUE: Informed written consent was obtained from the patient after a
discussion of the risks, benefits and alternatives to treatment. A
timeout was performed prior to the initiation of the procedure.

[Series 1: us guide needle - us para · 0.27mm/px · 8 of 8 slices shown]
[im 1/8]
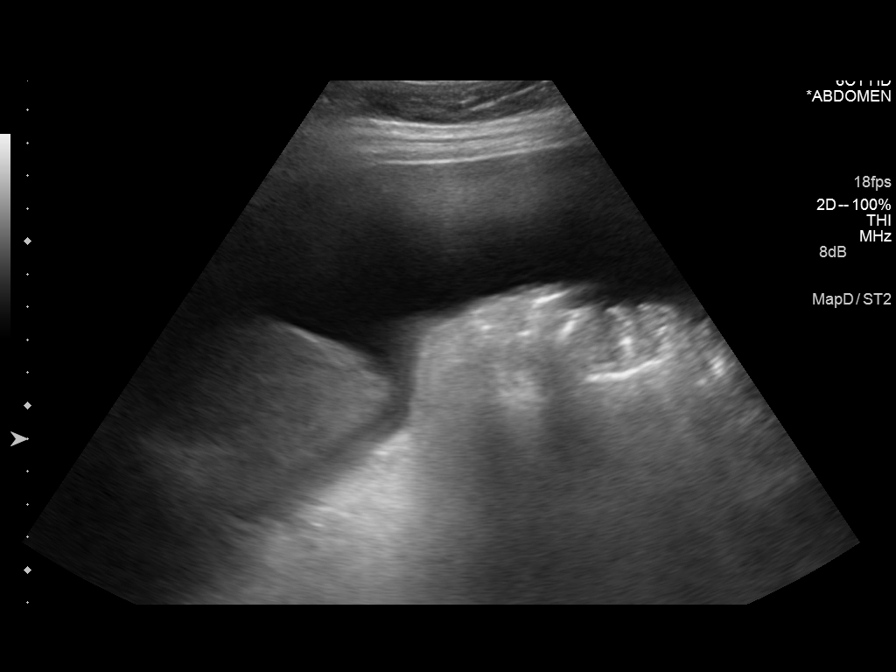
[im 2/8]
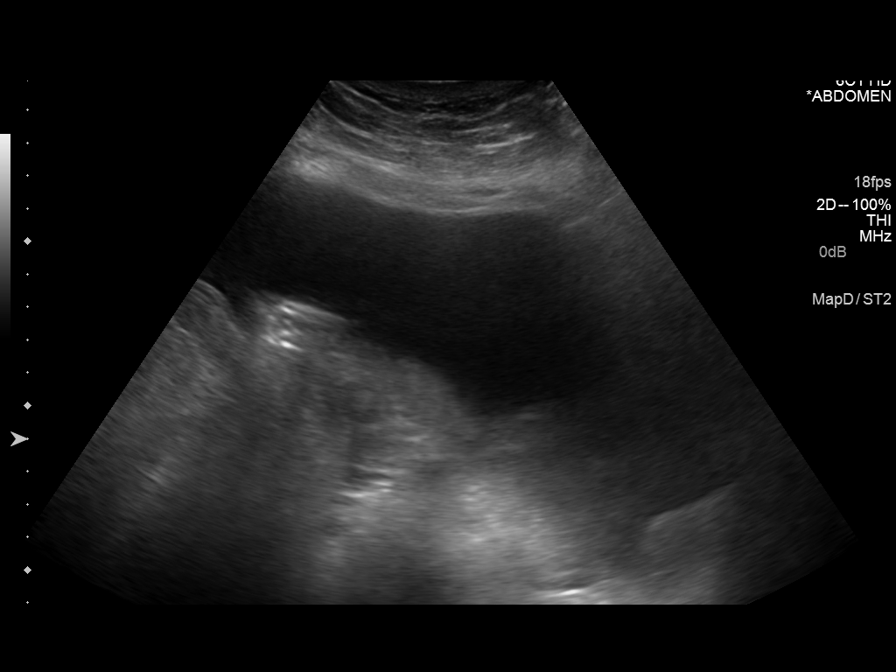
[im 3/8]
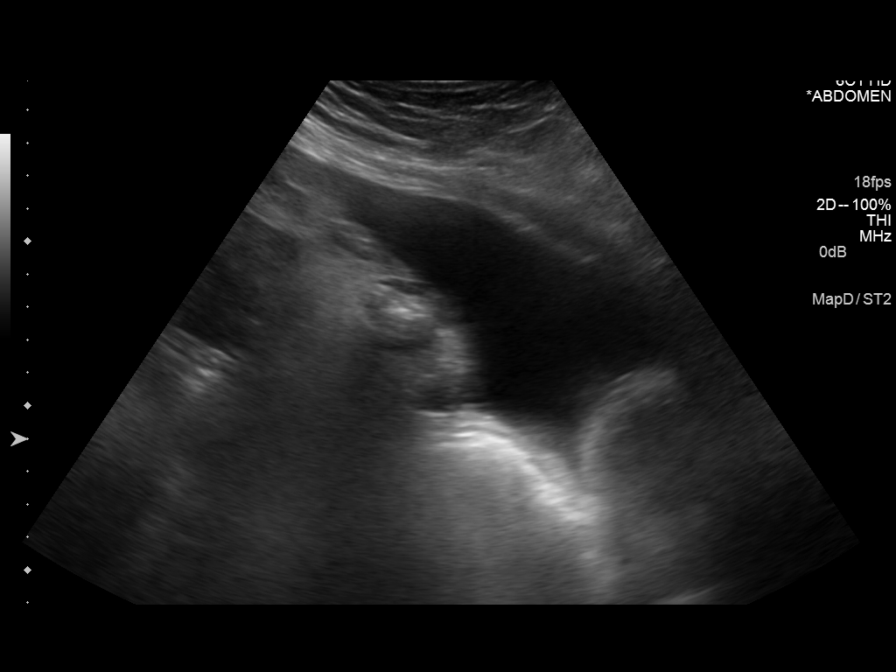
[im 4/8]
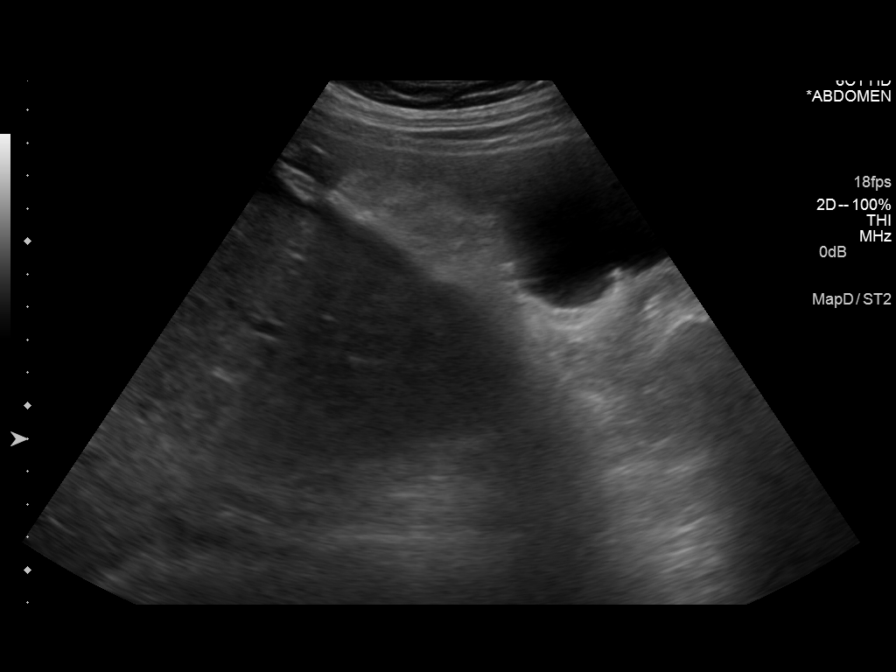
[im 5/8]
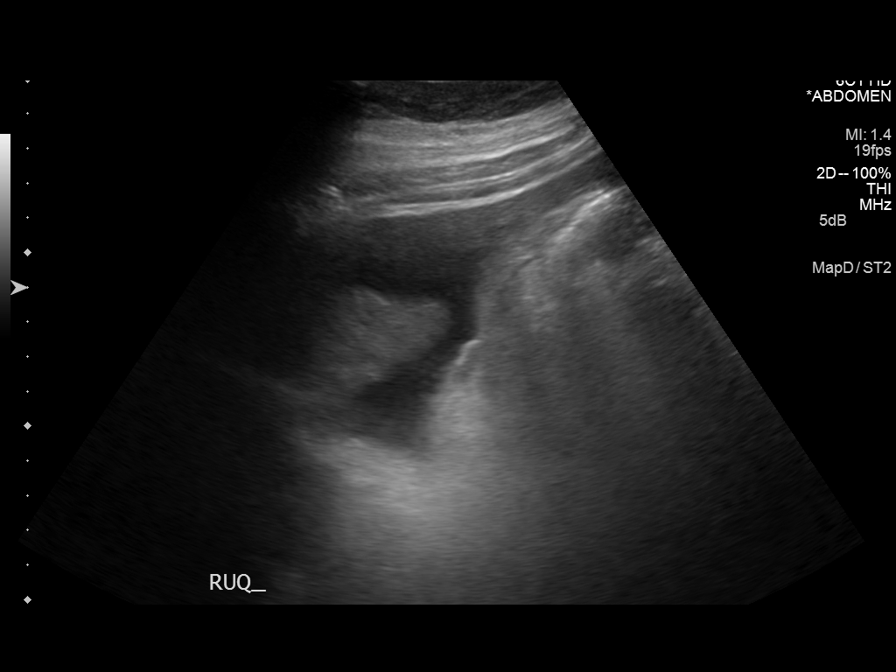
[im 6/8]
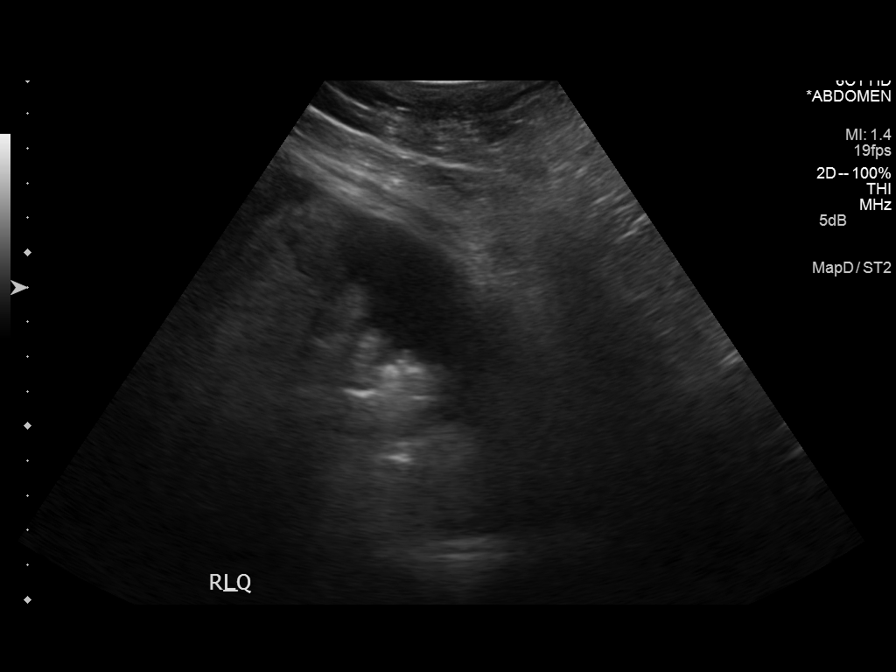
[im 7/8]
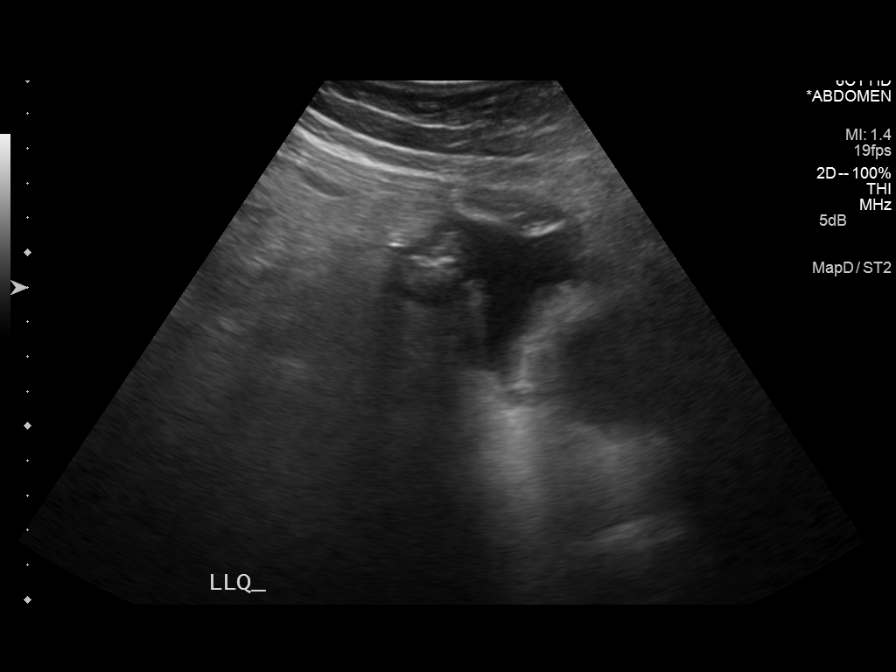
[im 8/8]
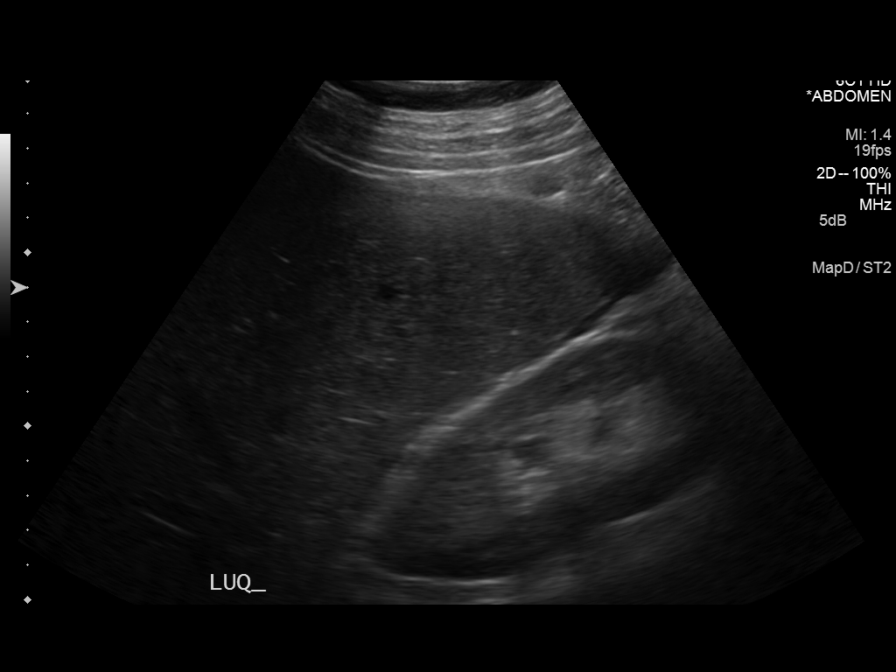

[8 of 8 positions shown; findings below may reference images not displayed]

MEDICATIONS:
None.

COMPLICATIONS:
None immediate

Note, the patient was noted to be anemic on preprocedural labs and
as such will be admitted following the paracentesis for further
evaluation and management as per the patient's primary physician.
Initial ultrasound scanning demonstrates a large amount of ascites
within the right lower abdominal quadrant. The right lower abdomen
was prepped and draped in the usual sterile fashion. 1% lidocaine
with epinephrine was used for local anesthesia. An ultrasound image
was saved for documentation purposed. An 8 Fr Safe-T-Centesis
catheter was introduced. The paracentesis was performed. The
catheter was removed and a dressing was applied. The patient
tolerated the procedure well without immediate post procedural
complication.
FINDINGS: A total of approximately 5 liters of serous, non bloody fluid was
removed.
IMPRESSION: Successful ultrasound-guided paracentesis yielding 5 liters of
peritoneal fluid.
